# Patient Record
Sex: Male | Born: 1950 | Race: White | Hispanic: No | Marital: Married | Smoking: Former smoker
Health system: Southern US, Community
[De-identification: ages and names within clinical notes are randomized; demographics above are authoritative.]

## PROBLEM LIST (undated history)

## (undated) DIAGNOSIS — R7989 Other specified abnormal findings of blood chemistry: Secondary | ICD-10-CM

## (undated) DIAGNOSIS — Z87438 Personal history of other diseases of male genital organs: Secondary | ICD-10-CM

## (undated) DIAGNOSIS — E785 Hyperlipidemia, unspecified: Secondary | ICD-10-CM

## (undated) HISTORY — DX: Personal history of other diseases of male genital organs: Z87.438

## (undated) HISTORY — DX: Hyperlipidemia, unspecified: E78.5

## (undated) HISTORY — DX: Other specified abnormal findings of blood chemistry: R79.89

## (undated) HISTORY — PX: COLONOSCOPY W/ BIOPSIES: SHX1374

---

## 2007-07-18 ENCOUNTER — Ambulatory Visit: Payer: Self-pay | Admitting: Urology

## 2007-07-18 IMAGING — CR DG ABDOMEN 1V
1 series · 1 of 1 positions shown · non-contrast
Comparison: none

REASON FOR EXAM: nephrolithiasis, pt need films
COMMENTS:

PROCEDURE:     DXR - DXR KIDNEY URETER BLADDER  - [DATE] [DATE]
RESULT:     The bowel gas pattern is within the limits of normal. There is
faint calcification in the LEFT kidney. The bony structures are within the
limits of normal for age.

[view not recorded]
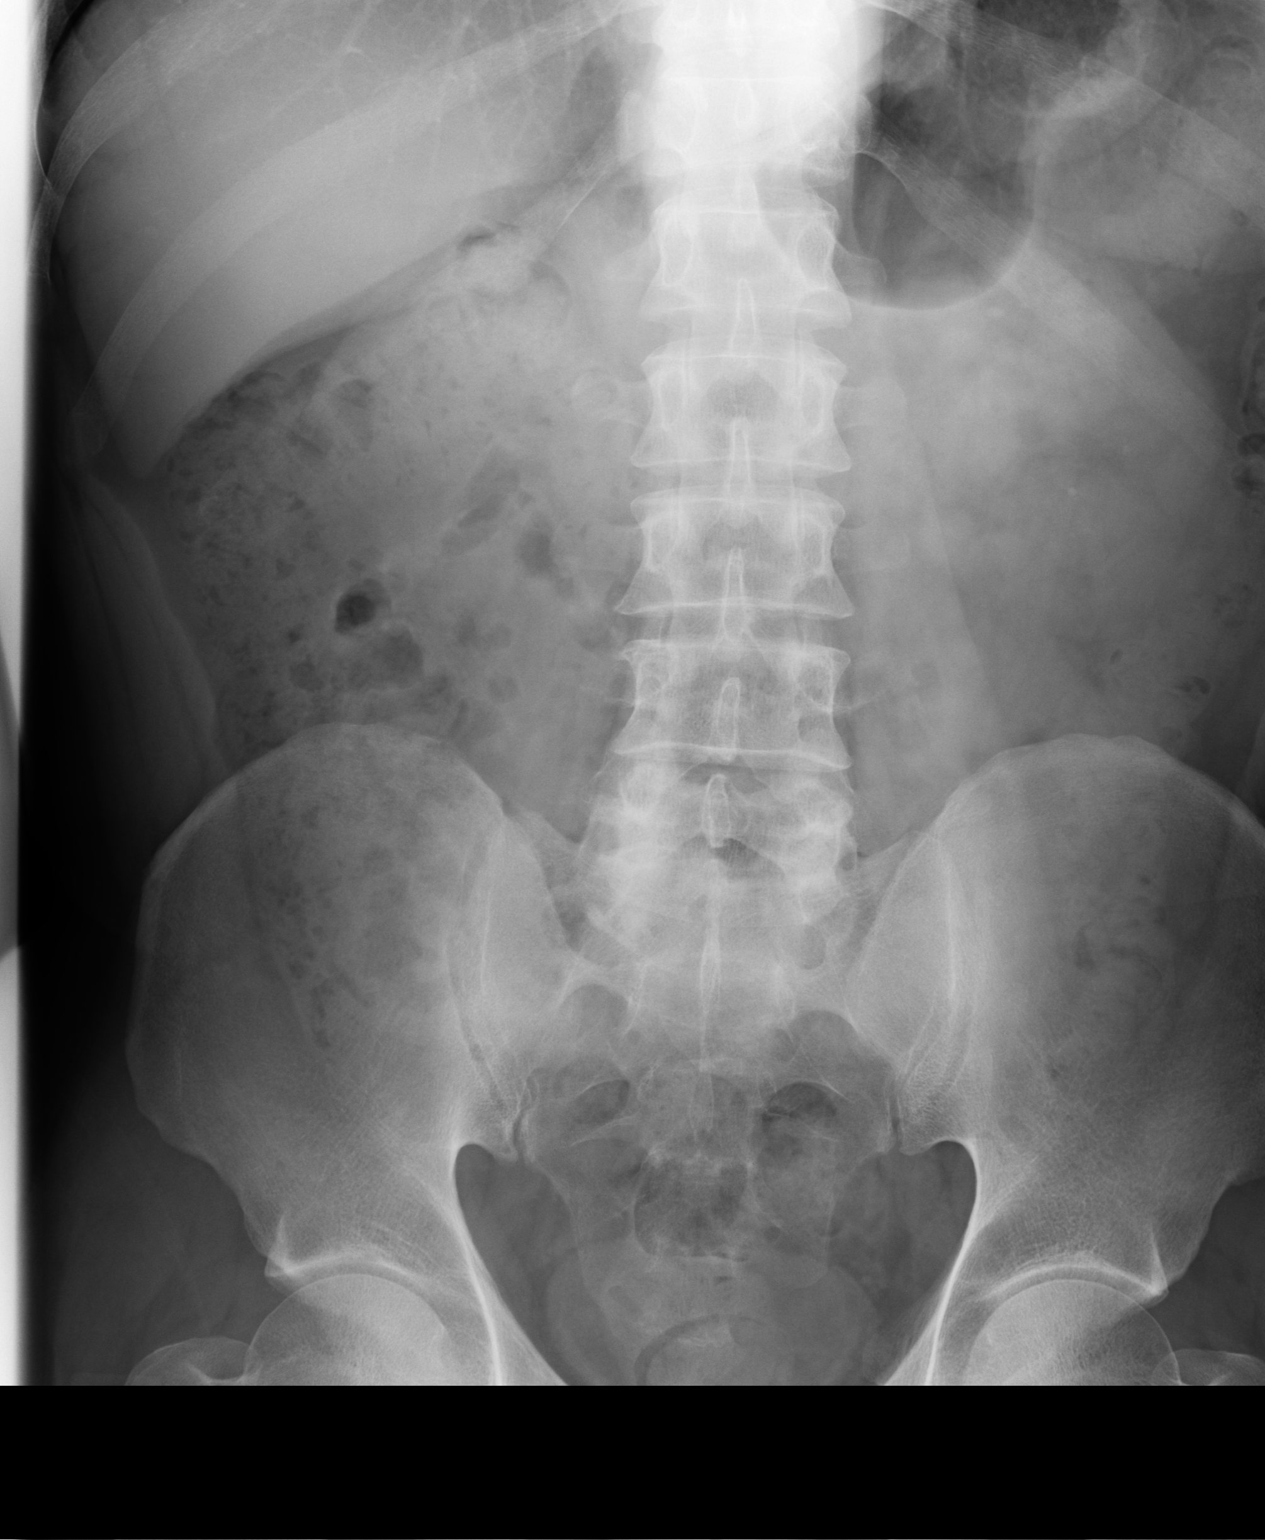

[1 of 1 positions shown; findings below may reference images not displayed]

IMPRESSION: There are faint calcific densities projecting over the LEFT
kidney and possibly over the RIGHT. These may reflect stones. Further
interpretation is deferred to Dr. LAHLAL.

## 2008-03-19 ENCOUNTER — Ambulatory Visit: Payer: Self-pay | Admitting: Urology

## 2014-03-23 ENCOUNTER — Ambulatory Visit: Payer: Self-pay | Admitting: Gastroenterology

## 2014-06-22 LAB — SURGICAL PATHOLOGY

## 2014-12-18 ENCOUNTER — Ambulatory Visit (INDEPENDENT_AMBULATORY_CARE_PROVIDER_SITE_OTHER): Payer: Worker's Compensation

## 2014-12-18 ENCOUNTER — Ambulatory Visit
Admission: EM | Admit: 2014-12-18 | Discharge: 2014-12-18 | Disposition: A | Discharge status: Home / Self Care | Payer: Worker's Compensation | Attending: Family Medicine | Admitting: Family Medicine

## 2014-12-18 DIAGNOSIS — S46911A Strain of unspecified muscle, fascia and tendon at shoulder and upper arm level, right arm, initial encounter: Secondary | ICD-10-CM

## 2014-12-18 MED ORDER — IBUPROFEN 800 MG PO TABS: 800.0000 mg | ORAL_TABLET | Freq: Three times a day (TID) | ORAL | Status: DC

## 2014-12-18 NOTE — ED Provider Notes (Signed)
CSN: [id number]     Arrival date & time 12/18/14  1013 History   First MD Initiated Contact with Patient 12/18/14 1054     Chief Complaint  Patient presents with  . Shoulder Injury   (Consider location/radiation/quality/duration/timing/severity/associated sxs/prior Treatment) HPI Comments: Single caucasian male reported 1 month ago at [facility] was maintaining equipment changing out cylinder kneeling and wrench slipped and he fell on outstretched right hand felt pop in his shoulder.  Right hand dominant.  Has tried rest, ice, stretching, motrin without any relief of symptoms.  Grip feels weaker right hand, when he wakes up right hand swollen compared to left.  Denied bruising, has full range of motion with pain.  Patient is a 64 y.o. male presenting with shoulder injury. The history is provided by the patient.  Shoulder Injury This is a new problem. The current episode started more than 1 week ago. The problem occurs constantly. The problem has not changed since onset.Pertinent negatives include no chest pain, no abdominal pain, no headaches and no shortness of breath. The symptoms are aggravated by exertion. Nothing relieves the symptoms. He has tried water, food and rest (motrin po prn) for the symptoms.    History reviewed. No pertinent past medical history. History reviewed. No pertinent past surgical history. History reviewed. No pertinent family history. Social History  Substance Use Topics  . Smoking status: Former Games developer  . Smokeless tobacco: None  . Alcohol Use: Yes     Comment: socially    Review of Systems  Constitutional: Negative for fever, chills, diaphoresis, activity change, appetite change, fatigue and unexpected weight change.  HENT: Negative for congestion, dental problem, drooling, ear discharge, ear pain, facial swelling, hearing loss, mouth sores and nosebleeds.   Eyes: Negative for photophobia, pain, discharge, redness, itching and visual disturbance.  Respiratory:  Negative for cough, choking, shortness of breath, wheezing and stridor.   Cardiovascular: Negative for chest pain, palpitations and leg swelling.  Gastrointestinal: Negative for nausea, vomiting, abdominal pain, diarrhea, constipation, blood in stool and abdominal distention.  Endocrine: Negative for cold intolerance and heat intolerance.  Genitourinary: Negative for dysuria.  Musculoskeletal: Positive for myalgias and arthralgias. Negative for back pain, joint swelling, gait problem, neck pain and neck stiffness.  Skin: Negative for color change, pallor, rash and wound.  Allergic/Immunologic: Negative for environmental allergies and food allergies.  Neurological: Positive for weakness and numbness. Negative for dizziness, tremors, seizures, syncope, facial asymmetry, speech difficulty, light-headedness and headaches.  Hematological: Negative for adenopathy. Does not bruise/bleed easily.  Psychiatric/Behavioral: Negative for behavioral problems, confusion, sleep disturbance and agitation.    Allergies  Review of patient's allergies indicates no known allergies.  Home Medications   Prior to Admission medications   Medication Sig Start Date End Date Taking? Authorizing Provider  ibuprofen (ADVIL,MOTRIN) 800 MG tablet Take 1 tablet (800 mg total) by mouth 3 (three) times daily. 12/18/14   Barbaraann Barthel, NP   Meds Ordered and Administered this Visit  Medications - No data to display  BP 149/80 mmHg  Pulse 75  Temp(Src) 97.2 F (36.2 C) (Tympanic)  Resp 16  Ht  (1.778 m)  Wt 168 lb (76.204 kg)  BMI 24.11 kg/m2  SpO2 98% No data found.   Physical Exam  Constitutional: He is oriented to person, place, and time. Vital signs are normal. He appears well-developed and well-nourished. No distress.  HENT:  Head: Normocephalic and atraumatic.  Right Ear: External ear normal.  Left Ear: External ear normal.  Nose:  Nose normal.  Mouth/Throat: Oropharynx is clear and moist. No  oropharyngeal exudate.  Eyes: Conjunctivae, EOM and lids are normal. Pupils are equal, round, and reactive to light. Right eye exhibits no discharge. Left eye exhibits no discharge. No scleral icterus.  Neck: Trachea normal and normal range of motion. Neck supple. No tracheal tenderness, no spinous process tenderness and no muscular tenderness present. No rigidity. No tracheal deviation, no edema, no erythema and normal range of motion present.  Cardiovascular: Normal rate, regular rhythm, normal heart sounds and intact distal pulses.   Pulmonary/Chest: Effort normal and breath sounds normal. No stridor. No respiratory distress. He has no wheezes. He has no rales.  Abdominal: Soft. He exhibits no distension.  Musculoskeletal: He exhibits no edema or tenderness.       Right shoulder: He exhibits decreased range of motion and pain. He exhibits no tenderness, no bony tenderness, no swelling, no effusion, no crepitus, no deformity, no laceration, no spasm, normal pulse and normal strength.       Left shoulder: Normal.       Right elbow: Normal.      Left elbow: Normal.       Right wrist: Normal.       Left wrist: Normal.       Cervical back: Normal.       Thoracic back: Normal.       Right upper arm: Normal.       Left upper arm: Normal.       Right forearm: Normal.       Left forearm: Normal.       Arms:      Right hand: Normal.       Left hand: Normal.  Neurological: He is alert and oriented to person, place, and time. He is not disoriented. He displays no atrophy, no tremor and normal reflexes. No cranial nerve deficit or sensory deficit. He exhibits normal muscle tone. He displays no seizure activity. Coordination and gait normal. GCS eye subscore is 4. GCS verbal subscore is 5. GCS motor subscore is 6.  Reflex Scores:      Brachioradialis reflexes are 1+ on the right side and 1+ on the left side. Bilateral hand grasp equal bilateral strength extremities 5/5 bilaterally  Skin: Skin is warm,  dry and intact. No abrasion, no bruising, no burn, no ecchymosis, no laceration, no lesion, no petechiae and no rash noted. He is not diaphoretic. No cyanosis or erythema. No pallor. Nails show no clubbing.  Psychiatric: He has a normal mood and affect. His speech is normal and behavior is normal. Judgment and thought content normal. Cognition and memory are normal.  Nursing note and vitals reviewed.   ED Course  Procedures (including critical care time)  Labs Review Labs Reviewed - No data to display  Imaging Review Dg Shoulder Right  12/18/2014  CLINICAL DATA:  Shoulder pain.  Injury 1 month ago. EXAM: RIGHT SHOULDER - 2+ VIEW COMPARISON:  None. FINDINGS: There is no evidence of fracture or dislocation. There is no evidence of arthropathy or other focal bone abnormality. Soft tissues are unremarkable. IMPRESSION: Negative. Electronically Signed   By: Marlan Palauharles  Clark M.D.   On: 12/18/2014 11:26    1145 discussed xray results normal/negative for fracture or dislocation. Given copy of radiology report.  Patient preferred to continue motrin 800mg  po TID.  Rx for PT right shoulder TIW x 6 weeks given to patient and he is to follow up with HR rep for scheduling of appt.  Discussed companies typically have contract with a provider.  Follow up in 10 days for re-evaluation.  Perform shoulder exercises per exitcare handout until first PT appt.  Avoid keeping arm in sling but avoid lifting/pushing/pulling greater than 5 lbs, impact to right hand.  Patient verbalized understanding of information/instructions, agreed with plan of care and had no further questions at this time.  MDM   1. Right shoulder strain, initial encounter    Motrin  po TID prn pain cryotherapy TID prn.  Rx for physical therapy three times per week x 6 weeks given to patient.  Home stretches demonstrated to patient-e.g. Arm circles, walking up wall  Consider orthopedics referral/MRI if no improvement with prescribed therapy.   Avoid lifting with right hand, avoid impact and vibrations to right hand.  Patient was instructed to rest, ice, and ROM exercises.  Activity as tolerated.    Follow up sooner if symptoms worsen or resolve prior to 10 days.  Exitcare handout on shoulder strain, rotator cuff injury, shoulder pain given to patient.  Patient verbalized agreement and understanding of treatment plan and had no further questions at this time.  P2:  Injury Prevention and Fitness.    Barbaraann Barthel, NP 12/18/14 1407

## 2014-12-18 NOTE — ED Notes (Signed)
States at work 1 month ago and pulling a cylinder out of something and fell backwards and felt a "pop" right shoulder. Pain not improving. Able to lift arm, but very painful

## 2014-12-18 NOTE — Discharge Instructions (Signed)
Cryotherapy °Cryotherapy means treatment with cold. Ice or gel packs can be used to reduce both pain and swelling. Ice is the most helpful within the first 24 to 48 hours after an injury or flare-up from overusing a muscle or joint. Sprains, strains, spasms, burning pain, shooting pain, and aches can all be eased with ice. Ice can also be used when recovering from surgery. Ice is effective, has very few side effects, and is safe for most people to use. °PRECAUTIONS  °Ice is not a safe treatment option for people with: °· Raynaud phenomenon. This is a condition affecting small blood vessels in the extremities. Exposure to cold may cause your problems to return. °· Cold hypersensitivity. There are many forms of cold hypersensitivity, including: °· Cold urticaria. Red, itchy hives appear on the skin when the tissues begin to warm after being iced. °· Cold erythema. This is a red, itchy rash caused by exposure to cold. °· Cold hemoglobinuria. Red blood cells break down when the tissues begin to warm after being iced. The hemoglobin that carry oxygen are passed into the urine because they cannot combine with blood proteins fast enough. °· Numbness or altered sensitivity in the area being iced. °If you have any of the following conditions, do not use ice until you have discussed cryotherapy with your caregiver: °· Heart conditions, such as arrhythmia, angina, or chronic heart disease. °· High blood pressure. °· Healing wounds or open skin in the area being iced. °· Current infections. °· Rheumatoid arthritis. °· Poor circulation. °· Diabetes. °Ice slows the blood flow in the region it is applied. This is beneficial when trying to stop inflamed tissues from spreading irritating chemicals to surrounding tissues. However, if you expose your skin to cold temperatures for too long or without the proper protection, you can damage your skin or nerves. Watch for signs of skin damage due to cold. °HOME CARE INSTRUCTIONS °Follow  these tips to use ice and cold packs safely. °· Place a dry or damp towel between the ice and skin. A damp towel will cool the skin more quickly, so you may need to shorten the time that the ice is used. °· For a more rapid response, add gentle compression to the ice. °· Ice for no more than 10 to 20 minutes at a time. The bonier the area you are icing, the less time it will take to get the benefits of ice. °· Check your skin after 5 minutes to make sure there are no signs of a poor response to cold or skin damage. °· Rest 20 minutes or more between uses. °· Once your skin is numb, you can end your treatment. You can test numbness by very lightly touching your skin. The touch should be so light that you do not see the skin dimple from the pressure of your fingertip. When using ice, most people will feel these normal sensations in this order: cold, burning, aching, and numbness. °· Do not use ice on someone who cannot communicate their responses to pain, such as small children or people with dementia. °HOW TO MAKE AN ICE PACK °Ice packs are the most common way to use ice therapy. Other methods include ice massage, ice baths, and cryosprays. Muscle creams that cause a cold, tingly feeling do not offer the same benefits that ice offers and should not be used as a substitute unless recommended by your caregiver. °To make an ice pack, do one of the following: °· Place crushed ice or a   bag of frozen vegetables in a sealable plastic bag. Squeeze out the excess air. Place this bag inside another plastic bag. Slide the bag into a pillowcase or place a damp towel between your skin and the bag.  Mix 3 parts water with 1 part rubbing alcohol. Freeze the mixture in a sealable plastic bag. When you remove the mixture from the freezer, it will be slushy. Squeeze out the excess air. Place this bag inside another plastic bag. Slide the bag into a pillowcase or place a damp towel between your skin and the bag. SEEK MEDICAL CARE  IF:  You develop white spots on your skin. This may give the skin a blotchy (mottled) appearance.  Your skin turns blue or pale.  Your skin becomes waxy or hard.  Your swelling gets worse. MAKE SURE YOU:   Understand these instructions.  Will watch your condition.  Will get help right away if you are not doing well or get worse.   This information is not intended to replace advice given to you by your health care provider. Make sure you discuss any questions you have with your health care provider.   Document Released: 10/10/2010 Document Revised: 03/06/2014 Document Reviewed: 10/10/2010 Elsevier Interactive Patient Education 2016 [employer]  Muscle Strain A muscle strain is an injury that occurs when a muscle is stretched beyond its normal length. Usually a small number of muscle fibers are torn when this happens. Muscle strain is rated in degrees. First-degree strains have the least amount of muscle fiber tearing and pain. Second-degree and third-degree strains have increasingly more tearing and pain.  Usually, recovery from muscle strain takes 1-2 weeks. Complete healing takes 5-6 weeks.  CAUSES  Muscle strain happens when a sudden, violent force placed on a muscle stretches it too far. This may occur with lifting, sports, or a fall.  RISK FACTORS Muscle strain is especially common in athletes.  SIGNS AND SYMPTOMS At the site of the muscle strain, there may be:  Pain.  Bruising.  Swelling.  Difficulty using the muscle due to pain or lack of normal function. DIAGNOSIS  Your health care provider will perform a physical exam and ask about your medical history. TREATMENT  Often, the best treatment for a muscle strain is resting, icing, and applying cold compresses to the injured area.  HOME CARE INSTRUCTIONS   Use the PRICE method of treatment to promote muscle healing during the first 2-3 days after your injury. The PRICE method involves:  Protecting the muscle  from being injured again.  Restricting your activity and resting the injured body part.  Icing your injury. To do this, put ice in a plastic bag. Place a towel between your skin and the bag. Then, apply the ice and leave it on from 15-20 minutes each hour. After the third day, switch to moist heat packs.  Apply compression to the injured area with a splint or elastic bandage. Be careful not to wrap it too tightly. This may interfere with blood circulation or increase swelling.  Elevate the injured body part above the level of your heart as often as you can.  Only take over-the-counter or prescription medicines for pain, discomfort, or fever as directed by your health care provider.  Warming up prior to exercise helps to prevent future muscle strains. SEEK MEDICAL CARE IF:   You have increasing pain or swelling in the injured area.  You have numbness, tingling, or a significant loss of strength in the injured area. MAKE SURE  YOU:   Understand these instructions.  Will watch your condition.  Will get help right away if you are not doing well or get worse.   This information is not intended to replace advice given to you by your health care provider. Make sure you discuss any questions you have with your health care provider.   Document Released: 02/13/2005 Document Revised: 12/04/2012 Document Reviewed: 09/12/2012 Elsevier Interactive Patient Education 2016 [employer] Rotator Cuff Injury Rotator cuff injury is any type of injury to the set of muscles and tendons that make up the stabilizing unit of your shoulder. This unit holds the ball of your upper arm bone (humerus) in the socket of your shoulder blade (scapula).  CAUSES Injuries to your rotator cuff most commonly come from sports or activities that cause your arm to be moved repeatedly over your head. Examples of this include throwing, weight lifting, swimming, or racquet sports. Long lasting (chronic) irritation of your  rotator cuff can cause soreness and swelling (inflammation), bursitis, and eventual damage to your tendons, such as a tear (rupture). SIGNS AND SYMPTOMS Acute rotator cuff tear:  Sudden tearing sensation followed by severe pain shooting from your upper shoulder down your arm toward your elbow.  Decreased range of motion of your shoulder because of pain and muscle spasm.  Severe pain.  Inability to raise your arm out to the side because of pain and loss of muscle power (large tears). Chronic rotator cuff tear:  Pain that usually is worse at night and may interfere with sleep.  Gradual weakness and decreased shoulder motion as the pain worsens.  Decreased range of motion. Rotator cuff tendinitis:  Deep ache in your shoulder and the outside upper arm over your shoulder.  Pain that comes on gradually and becomes worse when lifting your arm to the side or turning it inward. DIAGNOSIS Rotator cuff injury is diagnosed through a medical history, physical exam, and imaging exam. The medical history helps determine the type of rotator cuff injury. Your health care provider will look at your injured shoulder, feel the injured area, and ask you to move your shoulder in different positions. X-ray exams typically are done to rule out other causes of shoulder pain, such as fractures. MRI is the exam of choice for the most severe shoulder injuries because the images show muscles and tendons.  TREATMENT  Chronic tear:  Medicine for pain, such as acetaminophen or ibuprofen.  Physical therapy and range-of-motion exercises may be helpful in maintaining shoulder function and strength.  Steroid injections into your shoulder joint.  Surgical repair of the rotator cuff if the injury does not heal with noninvasive treatment. Acute tear:  Anti-inflammatory medicines such as ibuprofen and naproxen to help reduce pain and swelling.  A sling to help support your arm and rest your rotator cuff muscles.  Long-term use of a sling is not advised. It may cause significant stiffening of the shoulder joint.  Surgery may be considered within a few weeks, especially in younger, active people, to return the shoulder to full function.  Indications for surgical treatment include the following:  Age younger than 60 years.  Rotator cuff tears that are complete.  Physical therapy, rest, and anti-inflammatory medicines have been used for 6-8 weeks, with no improvement.  Employment or sporting activity that requires constant shoulder use. Tendinitis:  Anti-inflammatory medicines such as ibuprofen and naproxen to help reduce pain and swelling.  A sling to help support your arm and rest your rotator cuff muscles. Long-term  use of a sling is not advised. It may cause significant stiffening of the shoulder joint.  Severe tendinitis may require:  Steroid injections into your shoulder joint.  Physical therapy.  Surgery. HOME CARE INSTRUCTIONS   Apply ice to your injury:  Put ice in a plastic bag.  Place a towel between your skin and the bag.  Leave the ice on for 20 minutes, 2-3 times a day.  If you have a shoulder immobilizer (sling and straps), wear it until told otherwise by your health care provider.  You may want to sleep on several pillows or in a recliner at night to lessen swelling and pain.  Only take over-the-counter or prescription medicines for pain, discomfort, or fever as directed by your health care provider.  Do simple hand squeezing exercises with a soft rubber ball to decrease hand swelling. SEEK MEDICAL CARE IF:   Your shoulder pain increases, or new pain or numbness develops in your arm, hand, or fingers.  Your hand or fingers are colder than your other hand. SEEK IMMEDIATE MEDICAL CARE IF:   Your arm, hand, or fingers are numb or tingling.  Your arm, hand, or fingers are increasingly swollen and painful, or they turn white or blue. MAKE SURE YOU:  Understand  these instructions.  Will watch your condition.  Will get help right away if you are not doing well or get worse.   This information is not intended to replace advice given to you by your health care provider. Make sure you discuss any questions you have with your health care provider.   Document Released: 02/11/2000 Document Revised: 02/18/2013 Document Reviewed: 09/25/2012 Elsevier Interactive Patient Education 2016 [employer] Shoulder Sprain A shoulder sprain is a partial or complete tear in one of the tough, fiber-like tissues (ligaments) in the shoulder. The ligaments in the shoulder help to hold the shoulder in place. CAUSES This condition may be caused by:  A fall.  A hit to the shoulder.  A twist of the arm. RISK FACTORS This condition is more likely to develop in:  People who play sports.  People who have problems with balance or coordination. SYMPTOMS Symptoms of this condition include:  Pain when moving the shoulder.  Limited ability to move the shoulder.  Swelling and tenderness on top of the shoulder.  Warmth in the shoulder.  A change in the shape of the shoulder.  Redness or bruising on the shoulder. DIAGNOSIS This condition is diagnosed with a physical exam. During the exam, you may be asked to do simple exercises with your shoulder. You may also have imaging tests, such as X-rays, MRI, or a CT scan. These tests can show how severe the sprain is. TREATMENT This condition may be treated with:  Rest.  Pain medicine.  Ice.  A sling or brace. This is used to keep the arm still while the shoulder is healing.  Physical therapy or rehabilitation exercises. These help to improve the range of motion and strength of the shoulder.  Surgery (rare). Surgery may be needed if the sprain caused a joint to become unstable. Surgery may also be needed to reduce pain. Some people may develop ongoing shoulder pain or lose some range of motion in the shoulder.  However, most people do not develop long-term problems. HOME CARE INSTRUCTIONS  Rest.  Take over-the-counter and prescription medicines only as told by your health care provider.  If directed, apply ice to the area:  Put ice in a plastic bag.  Place  a towel between your skin and the bag.  Leave the ice on for 20 minutes, 2-3 times per day.  If you were given a shoulder sling or brace:  Wear it as told.  Remove it to shower or bathe.  Move your arm only as much as told by your health care provider, but keep your hand moving to prevent swelling.  If you were shown how to do any exercises, do them as told by your health care provider.  Keep all follow-up visits as told by your health care provider. This is important. SEEK MEDICAL CARE IF:  Your pain gets worse.  Your pain is not relieved with medicines.  You have increased redness or swelling. SEEK IMMEDIATE MEDICAL CARE IF:  You have a fever.  You cannot move your arm or shoulder.  You develop numbness or tingling in your arms, hands, or fingers.   This information is not intended to replace advice given to you by your health care provider. Make sure you discuss any questions you have with your health care provider.   Document Released: 07/02/2008 Document Revised: 11/04/2014 Document Reviewed: 06/08/2014 Elsevier Interactive Patient Education 2016 [employer] Shoulder Range of Motion Exercises Shoulder range of motion (ROM) exercises are designed to keep the shoulder moving freely. They are often recommended for people who have shoulder pain. MOVEMENT EXERCISE When you are able, do this exercise 5-6 days per week, or as told by your health care provider. Work toward doing 2 sets of 10 swings. Pendulum Exercise How To Do This Exercise Lying Down  Lie face-down on a bed with your abdomen close to the side of the bed.  Let your arm hang over the side of the bed.  Relax your shoulder, arm, and hand.  Slowly and  gently swing your arm forward and back. Do not use your neck muscles to swing your arm. They should be relaxed. If you are struggling to swing your arm, have someone gently swing it for you. When you do this exercise for the first time, swing your arm at a 15 degree angle for 15 seconds, or swing your arm 10 times. As pain lessens over time, increase the angle of the swing to 30-45 degrees.  Repeat steps 1-4 with the other arm. How To Do This Exercise While Standing  Stand next to a sturdy chair or table and hold on to it with your hand.  Bend forward at the waist.  Bend your knees slightly.  Relax your other arm and let it hang limp.  Relax the shoulder blade of the arm that is hanging and let it drop.  While keeping your shoulder relaxed, use body motion to swing your arm in small circles. The first time you do this exercise, swing your arm for about 30 seconds or 10 times. When you do it next time, swing your arm for a little longer.  Stand up tall and relax.  Repeat steps 1-7, this time changing the direction of the circles.  Repeat steps 1-8 with the other arm. STRETCHING EXERCISES Do these exercises 3-4 times per day on 5-6 days per week or as told by your health care provider. Work toward holding the stretch for 20 seconds. Stretching Exercise 1  Lift your arm straight out in front of you.  Bend your arm 90 degrees at the elbow (right angle) so your forearm goes across your body and looks like the letter "L."  Use your other arm to gently pull the elbow forward  and across your body.  Repeat steps 1-3 with the other arm. Stretching Exercise 2 You will need a towel or rope for this exercise.  Bend one arm behind your back with the palm facing outward.  Hold a towel with your other hand.  Reach the arm that holds the towel above your head, and bend that arm at the elbow. Your wrist should be behind your neck.  Use your free hand to grab the free end of the towel.  With  the higher hand, gently pull the towel up behind you.  With the lower hand, pull the towel down behind you.  Repeat steps 1-6 with the other arm. STRENGTHENING EXERCISES Do each of these exercises at four different times of day (sessions) every day or as told by your health care provider. To begin with, repeat each exercise 5 times (repetitions). Work toward doing 3 sets of 12 repetitions or as told by your health care provider. Strengthening Exercise 1 You will need a light weight for this activity. As you grow stronger, you may use a heavier weight.  Standing with a weight in your hand, lift your arm straight out to the side until it is at the same height as your shoulder.  Bend your arm at 90 degrees so that your fingers are pointing to the ceiling.  Slowly raise your hand until your arm is straight up in the air.  Repeat steps 1-3 with the other arm. Strengthening Exercise 2 You will need a light weight for this activity. As you grow stronger, you may use a heavier weight.  Standing with a weight in your hand, gradually move your straight arm in an arc, starting at your side, then out in front of you, then straight up over your head.  Gradually move your other arm in an arc, starting at your side, then out in front of you, then straight up over your head.  Repeat steps 1-2 with the other arm. Strengthening Exercise 3 You will need an elastic band for this activity. As you grow stronger, gradually increase the size of the bands or increase the number of bands that you use at one time.  While standing, hold an elastic band in one hand and raise that arm up in the air.  With your other hand, pull down the band until that hand is by your side.  Repeat steps 1-2 with the other arm.   This information is not intended to replace advice given to you by your health care provider. Make sure you discuss any questions you have with your health care provider.   Document Released:  11/12/2002 Document Revised: 06/30/2014 Document Reviewed: 02/09/2014 Elsevier Interactive Patient Education [employer]

## 2015-01-12 ENCOUNTER — Encounter: Payer: Self-pay | Admitting: Family Medicine

## 2015-01-12 ENCOUNTER — Ambulatory Visit (INDEPENDENT_AMBULATORY_CARE_PROVIDER_SITE_OTHER): Payer: BLUE CROSS/BLUE SHIELD | Admitting: Family Medicine

## 2015-01-12 VITALS — BP 126/79 | HR 75 | Temp 97.8°F | Ht 69.5 in | Wt 166.0 lb

## 2015-01-12 DIAGNOSIS — E291 Testicular hypofunction: Secondary | ICD-10-CM | POA: Diagnosis not present

## 2015-01-12 DIAGNOSIS — N401 Enlarged prostate with lower urinary tract symptoms: Secondary | ICD-10-CM

## 2015-01-12 DIAGNOSIS — N138 Other obstructive and reflux uropathy: Secondary | ICD-10-CM

## 2015-01-12 DIAGNOSIS — E785 Hyperlipidemia, unspecified: Secondary | ICD-10-CM

## 2015-01-12 DIAGNOSIS — R7989 Other specified abnormal findings of blood chemistry: Secondary | ICD-10-CM | POA: Insufficient documentation

## 2015-01-12 DIAGNOSIS — Z0000 Encounter for general adult medical examination without abnormal findings: Secondary | ICD-10-CM

## 2015-01-12 DIAGNOSIS — L989 Disorder of the skin and subcutaneous tissue, unspecified: Secondary | ICD-10-CM

## 2015-01-12 LAB — URINALYSIS, ROUTINE W REFLEX MICROSCOPIC
Bilirubin, UA: NEGATIVE
Glucose, UA: NEGATIVE
Ketones, UA: NEGATIVE
LEUKOCYTES UA: NEGATIVE
Nitrite, UA: NEGATIVE
PROTEIN UA: NEGATIVE
RBC, UA: NEGATIVE
SPEC GRAV UA: 1.015 (ref 1.005–1.030)
UUROB: 0.2 mg/dL (ref 0.2–1.0)
pH, UA: 7.5 (ref 5.0–7.5)

## 2015-01-12 MED ORDER — ATORVASTATIN CALCIUM 10 MG PO TABS: 10.0000 mg | ORAL_TABLET | Freq: Every day | ORAL | Status: DC

## 2015-01-12 NOTE — Progress Notes (Signed)
BP 126/79 mmHg  Pulse 75  Temp(Src) 97.8 F (36.6 C)  Ht 5' 9.5" (1.765 m)  Wt 166 lb (75.297 kg)  BMI 24.17 kg/m2  SpO2 98%   Subjective:    Patient ID: Dennis Mclaughlin, male    DOB: 1950-03-25, 64 y.o.   MRN: [id number]  HPI: Dennis Mclaughlin is a 64 y.o. male  Chief Complaint  Patient presents with  . Establish Care   Patient doing well taking Lipitor for cholesterol management has high cholesterol without it Patient also with BPH symptoms with lower urinary tract symptoms has been on Avodart previously but not on recently and is interested in going back on medication or something as having symptoms of slow urine delayed stream Patient also has hurt his shoulder at work has my ibuprofen he takes on occasion  long-term no energy tired all the time could sleep a lot Asymmetry much just  sleeps and works no hobbies   Relevant past medical, surgical, family and social history reviewed and updated as indicated. Interim medical history since our last visit reviewed. Allergies and medications reviewed and updated.  Review of Systems  Constitutional: Negative.   HENT: Negative.   Eyes: Negative.   Respiratory: Negative.   Cardiovascular: Negative.   Gastrointestinal: Negative.   Endocrine: Negative.   Genitourinary: Negative.   Musculoskeletal: Negative.   Skin: Negative.   Allergic/Immunologic: Negative.   Neurological: Negative.   Hematological: Negative.   Psychiatric/Behavioral: Negative.     Per HPI unless specifically indicated above     Objective:    BP 126/79 mmHg  Pulse 75  Temp(Src) 97.8 F (36.6 C)  Ht 5' 9.5" (1.765 m)  Wt 166 lb (75.297 kg)  BMI 24.17 kg/m2  SpO2 98%  Wt Readings from Last 3 Encounters:  01/12/15 166 lb (75.297 kg)  12/18/14 168 lb (76.204 kg)    Physical Exam  Constitutional: He is oriented to person, place, and time. He appears well-developed and well-nourished.  HENT:  Head: Normocephalic and atraumatic.  Right Ear:  External ear normal.  Left Ear: External ear normal.  Eyes: Conjunctivae and EOM are normal. Pupils are equal, round, and reactive to light.  Neck: Normal range of motion. Neck supple.  Cardiovascular: Normal rate, regular rhythm, normal heart sounds and intact distal pulses.   Pulmonary/Chest: Effort normal and breath sounds normal.  Abdominal: Soft. Bowel sounds are normal. There is no splenomegaly or hepatomegaly.  Genitourinary: Rectum normal and penis normal.  Enlarged prostate  Musculoskeletal: Normal range of motion.  Neurological: He is alert and oriented to person, place, and time. He has normal reflexes.  Skin: No rash noted. No erythema.  Psychiatric: He has a normal mood and affect. His behavior is normal. Judgment and thought content normal.        Assessment & Plan:   Problem List Items Addressed This Visit      Musculoskeletal and Integument   Skin lesion of cheek    Nonhealing skin lesions with scabs and bleeding adjacent to left temple Will set up for shave biopsy        Genitourinary   BPH with obstruction/lower urinary tract symptoms    We'll give tamsulosin for now with referral to urology to further evaluate treatment and care      Relevant Orders   Ambulatory referral to Urology     Other   Low testosterone    Will check testosterone today in urology referral for further evaluation and management  Relevant Orders   Testosterone   Hyperlipidemia    The current medical regimen is effective;  continue present plan and medications.       Relevant Medications   atorvastatin (LIPITOR) 10 MG tablet    Other Visit Diagnoses    PE (physical exam), annual    -  Primary    Relevant Orders    Comprehensive metabolic panel    Lipid panel    CBC with Differential/Platelet    PSA    Urinalysis, Routine w reflex microscopic (not at [facility])    TSH        Follow up plan: Return in about 6 months (around 07/12/2015) for Follow-up 6 months for  recheck medication lipid panel, ALT, AST.

## 2015-01-12 NOTE — Assessment & Plan Note (Signed)
Will check testosterone today in urology referral for further evaluation and management

## 2015-01-12 NOTE — Assessment & Plan Note (Signed)
We'll give tamsulosin for now with referral to urology to further evaluate treatment and care

## 2015-01-12 NOTE — Assessment & Plan Note (Signed)
The current medical regimen is effective;  continue present plan and medications.  

## 2015-01-12 NOTE — Assessment & Plan Note (Signed)
Nonhealing skin lesions with scabs and bleeding adjacent to left temple Will set up for shave biopsy

## 2015-01-13 ENCOUNTER — Telehealth: Payer: Self-pay | Admitting: Family Medicine

## 2015-01-13 LAB — COMPREHENSIVE METABOLIC PANEL
ALBUMIN: 4.6 g/dL (ref 3.6–4.8)
ALK PHOS: 76 IU/L (ref 39–117)
ALT: 17 IU/L (ref 0–44)
AST: 21 IU/L (ref 0–40)
Albumin/Globulin Ratio: 2.4 (ref 1.1–2.5)
BUN / CREAT RATIO: 13 (ref 10–22)
BUN: 12 mg/dL (ref 8–27)
Bilirubin Total: 0.5 mg/dL (ref 0.0–1.2)
CO2: 24 mmol/L (ref 18–29)
CREATININE: 0.94 mg/dL (ref 0.76–1.27)
Calcium: 9.4 mg/dL (ref 8.6–10.2)
Chloride: 99 mmol/L (ref 97–106)
GFR calc Af Amer: 100 mL/min/{1.73_m2} (ref >59)
GFR calc non Af Amer: 87 mL/min/{1.73_m2} (ref >59)
GLUCOSE: 100 mg/dL — AB (ref 65–99)
Globulin, Total: 1.9 g/dL (ref 1.5–4.5)
Potassium: 4.3 mmol/L (ref 3.5–5.2)
Sodium: 140 mmol/L (ref 136–144)
TOTAL PROTEIN: 6.5 g/dL (ref 6.0–8.5)

## 2015-01-13 LAB — LIPID PANEL
CHOLESTEROL TOTAL: 271 mg/dL — AB (ref 100–199)
Chol/HDL Ratio: 5.8 ratio units — ABNORMAL HIGH (ref 0.0–5.0)
HDL: 47 mg/dL (ref >39)
LDL CALC: 205 mg/dL — AB (ref 0–99)
Triglycerides: 96 mg/dL (ref 0–149)
VLDL Cholesterol Cal: 19 mg/dL (ref 5–40)

## 2015-01-13 LAB — CBC WITH DIFFERENTIAL/PLATELET
BASOS: 1 %
Basophils Absolute: 0 10*3/uL (ref 0.0–0.2)
EOS (ABSOLUTE): 0.1 10*3/uL (ref 0.0–0.4)
Eos: 1 %
Hematocrit: 43.3 % (ref 37.5–51.0)
Hemoglobin: 14.5 g/dL (ref 12.6–17.7)
IMMATURE GRANULOCYTES: 0 %
Immature Grans (Abs): 0 10*3/uL (ref 0.0–0.1)
Lymphocytes Absolute: 1.7 10*3/uL (ref 0.7–3.1)
Lymphs: 25 %
MCH: 31 pg (ref 26.6–33.0)
MCHC: 33.5 g/dL (ref 31.5–35.7)
MCV: 93 fL (ref 79–97)
MONOS ABS: 0.5 10*3/uL (ref 0.1–0.9)
Monocytes: 8 %
NEUTROS ABS: 4.3 10*3/uL (ref 1.4–7.0)
NEUTROS PCT: 65 %
Platelets: 232 10*3/uL (ref 150–379)
RBC: 4.68 x10E6/uL (ref 4.14–5.80)
RDW: 13.1 % (ref 12.3–15.4)
WBC: 6.6 10*3/uL (ref 3.4–10.8)

## 2015-01-13 LAB — PSA: Prostate Specific Ag, Serum: 1.1 ng/mL (ref 0.0–4.0)

## 2015-01-13 LAB — TSH: TSH: 3.85 u[IU]/mL (ref 0.450–4.500)

## 2015-01-13 LAB — TESTOSTERONE: Testosterone: 180 ng/dL — ABNORMAL LOW (ref 348–1197)

## 2015-01-13 NOTE — Telephone Encounter (Signed)
-----   Message from Lurlean HornsNancy H Wilson, CMA sent at 01/13/2015  5:21 PM EST ----- labs

## 2015-01-13 NOTE — Telephone Encounter (Signed)
Phone call Discussed with patient cholesterol terribly high. Patient has not started cholesterol medicine yet discussed and patient will start. Recheck lipid panel next office visit

## 2015-02-04 ENCOUNTER — Ambulatory Visit
Admission: EM | Admit: 2015-02-04 | Discharge: 2015-02-04 | Disposition: A | Discharge status: Home / Self Care | Payer: Worker's Compensation | Attending: Family Medicine | Admitting: Family Medicine

## 2015-02-04 DIAGNOSIS — S46911D Strain of unspecified muscle, fascia and tendon at shoulder and upper arm level, right arm, subsequent encounter: Secondary | ICD-10-CM

## 2015-02-04 NOTE — ED Provider Notes (Signed)
[facility] Emergency Department Provider Note  ____________________________________________  Time seen: Approximately 11:51 AM  I have reviewed the triage vital signs and the nursing notes.   HISTORY  Chief Complaint  Shoulder Pain   HPI Dennis Mclaughlin is a 64 y.o. male presents for complaint of right shoulder pain follow-up. Patient states that he was seen at urgent care approximately 6 weeks ago for initial evaluation of right shoulder pain. Patient states at that time he had been referred for twice a week physical therapy. Patient reports that he has continued with physical therapy and reports that this is helping. Patient does report that he does still continue with some pain. 6 pain is not constant but mostly with arm lifting and raising. Denies pain radiation. Denies new injury.  Patient reports that he continues on a 10 pound weight restriction at this time. Patient states that his physical therapist has had some discussion with him as far as increasing weight restrictions. Patient reports that he works for [employer] and states that the original injury was due to a large piece of equipment slipping and him falling and hearing a pop in his right shoulder. Denies head injury or LOC. Denies other pain or injury. Patient states that injury did occur several weeks prior to even been seen 6 weeks ago in urgent care.  Patient again reports that overall he is feeling better but does still continue with some intermittent pain. Patient reports that at his job he lifts anywhere from 10 pounds to 50+ pounds. Patient reports he has continued to work however no lifting greater than 10 pounds.  Patient states that he is not fully sure of why he is present today but states that he was directed for follow-up.    Past Medical History  Diagnosis Date  . Hyperlipidemia   . History of prostatitis   . Low testosterone     Patient Active Problem List   Diagnosis Date Noted  .  Skin lesion of cheek 01/12/2015  . Hyperlipidemia 01/12/2015  . 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 Low testosterone 01/12/2015    Past Surgical History  Procedure Laterality Date  . Colonoscopy w/ biopsies      Current Outpatient Rx  Name  Route  Sig  Dispense  Refill  . atorvastatin (LIPITOR) 10 MG tablet   Oral   Take 1 tablet (10 mg total) by mouth daily.   90 tablet   4   . ibuprofen (ADVIL,MOTRIN) 800 MG tablet   Oral   Take 1 tablet (800 mg total) by mouth 3 (three) times daily. Patient taking differently: Take 800 mg by mouth every 8 (eight) hours as needed.    30 tablet   0     Allergies Review of patient's allergies indicates no known allergies.  No family history on file.  Social History Social History  Substance Use Topics  . Smoking status: Former Smoker    Types: Cigarettes    Quit date: 01/11/1990  . Smokeless tobacco: Never Used  . Alcohol Use: Yes     Comment: socially    Review of Systems Constitutional: No fever/chills Eyes: No visual changes. ENT: No sore throat.  Cardiovascular: Denies chest pain. Respiratory: Denies shortness of breath. Gastrointestinal: No abdominal pain.  No nausea, no vomiting.  No diarrhea.  No constipation. Genitourinary: Negative for dysuria. Musculoskeletal: Negative for back pain. Right shoulder pain.  Skin: Negative for rash. Neurological: Negative for headaches, focal weakness or numbness.  10-point ROS otherwise negative.  ____________________________________________   PHYSICAL EXAM:  VITAL SIGNS: ED Triage Vitals  Enc Vitals Group     BP 02/04/15 1038 134/92 mmHg     Pulse Rate 02/04/15 1038 72     Resp 02/04/15 1038 16     Temp 02/04/15 1038 97.6 F (36.4 C)     Temp Source 02/04/15 1038 Oral     SpO2 02/04/15 1038 100 %     Weight 02/04/15 1038 170 lb (77.111 kg)     Height 02/04/15 1038  (1.778 m)     Head Cir --      Peak Flow --      Pain Score  02/04/15 1039 0     Pain Loc --      Pain Edu? --      Excl. in GC? --     Constitutional: Alert and oriented. Well appearing and in no acute distress. Eyes: Conjunctivae are normal. PERRL. EOMI. Head: Atraumatic  Nose: No congestion/rhinnorhea.  Mouth/Throat: Mucous membranes are moist.   Neck: No stridor.  No cervical spine tenderness to palpation. Hematological/Lymphatic/Immunilogical: No cervical lymphadenopathy. Cardiovascular: Normal rate, regular rhythm. Grossly normal heart sounds.  Good peripheral circulation. Respiratory: Normal respiratory effort.  No retractions. Lungs CTAB. Gastrointestinal: Soft and nontender. Musculoskeletal: No lower or upper extremity tenderness nor edema. No cervical, thoracic, or lumbar TTP.  Full ROM to bilateral upper and lower extremities. Right shoulder mild anterior shoulder TTP, no swelling, no ecchymosis. Negative drop arm test, negative empty can test, mild pain with resisted abduction, no pain with resisted adduction. Extremities otherwise nontender. Bilateral hand grips equal. No motor or sensation deficits to upper extremities.  Neurologic:  Normal speech and language. No gross focal neurologic deficits are appreciated. No gait instability. Skin:  Skin is warm, dry and intact. No rash noted. Psychiatric: Mood and affect are normal. Speech and behavior are normal.  ____________________________________________   LABS (all labs ordered are listed, but only abnormal results are displayed)  Labs Reviewed - No data to display ____________________________________________   INITIAL IMPRESSION / ASSESSMENT AND PLAN / ED COURSE  Pertinent labs & imaging results that were available during my care of the patient were reviewed by me and considered in my medical decision making (see chart for details).  Very well-appearing patient. No acute distress. This is a subsequent encounter for right shoulder strain and right shoulder pain. 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 Patient has remained on light duty for at least the last 6 weeks due to injury and has been followed with physical therapy. Patient reports that pain has improved and denies pain radiation or knee pain. Patient states that he ia here as he was directed to have a follow-up. Patient with full range of motion present however with some right anterior shoulder pain continues.   As patient with continued pain as well as has continued with physical therapy, at this time will refer patient to follow-up with Tommie Maximiano Coss, FNP at [facility] for further follow up and management. Patient to continue current restrictions set in place and continue with current physical therapy prescription. No changes made at this visit, however patient was referred to follow-up. Patient follow-up with Francesco Sor  within 1 week. Patient denies need for pain medication.   Patient verbalized understanding to this plan and verbalized understanding. Patient agreed to plan.Discussed follow up with Primary care physician this week as needed. Discussed follow up and return parameters including no resolution  or any worsening concerns. Patient verbalized understanding and agreed to plan.   ____________________________________________   FINAL CLINICAL IMPRESSION(S) / ED DIAGNOSES  Final diagnoses:  Right shoulder strain, subsequent encounter       Renford Dills, NP 02/04/15 1251

## 2015-02-04 NOTE — Discharge Instructions (Signed)
[facility] with in one [facility].   Continue current restrictions and directions.   Return to Urgent care as needed for new or worsening concerns.

## 2015-02-04 NOTE — ED Notes (Signed)
Pt here for right shoulder injury WC, states he was told to follow up due to the 10lb restrictions and has been doing physical therapy and feels better.

## 2015-04-07 ENCOUNTER — Other Ambulatory Visit: Payer: Self-pay | Admitting: Orthopedic Surgery

## 2015-04-07 ENCOUNTER — Encounter (HOSPITAL_BASED_OUTPATIENT_CLINIC_OR_DEPARTMENT_OTHER): Payer: Self-pay | Admitting: *Deleted

## 2015-04-12 ENCOUNTER — Encounter (HOSPITAL_BASED_OUTPATIENT_CLINIC_OR_DEPARTMENT_OTHER): Payer: Self-pay | Admitting: *Deleted

## 2015-04-12 ENCOUNTER — Ambulatory Visit (HOSPITAL_BASED_OUTPATIENT_CLINIC_OR_DEPARTMENT_OTHER)
Admission: RE | Admit: 2015-04-12 | Discharge: 2015-04-12 | Disposition: A | Discharge status: Home / Self Care | Payer: Worker's Compensation | Source: Ambulatory Visit | Attending: Orthopedic Surgery | Admitting: Orthopedic Surgery

## 2015-04-12 ENCOUNTER — Ambulatory Visit (HOSPITAL_BASED_OUTPATIENT_CLINIC_OR_DEPARTMENT_OTHER): Payer: Worker's Compensation | Admitting: Anesthesiology

## 2015-04-12 ENCOUNTER — Encounter (HOSPITAL_BASED_OUTPATIENT_CLINIC_OR_DEPARTMENT_OTHER)
Admission: RE | Disposition: A | Discharge status: Home / Self Care | Payer: Self-pay | Source: Ambulatory Visit | Attending: Orthopedic Surgery

## 2015-04-12 DIAGNOSIS — Z79899 Other long term (current) drug therapy: Secondary | ICD-10-CM | POA: Insufficient documentation

## 2015-04-12 DIAGNOSIS — S46011A Strain of muscle(s) and tendon(s) of the rotator cuff of right shoulder, initial encounter: Secondary | ICD-10-CM | POA: Insufficient documentation

## 2015-04-12 DIAGNOSIS — Y990 Civilian activity done for income or pay: Secondary | ICD-10-CM | POA: Diagnosis not present

## 2015-04-12 DIAGNOSIS — M25511 Pain in right shoulder: Secondary | ICD-10-CM | POA: Diagnosis present

## 2015-04-12 DIAGNOSIS — E785 Hyperlipidemia, unspecified: Secondary | ICD-10-CM | POA: Insufficient documentation

## 2015-04-12 DIAGNOSIS — X58XXXA Exposure to other specified factors, initial encounter: Secondary | ICD-10-CM | POA: Insufficient documentation

## 2015-04-12 DIAGNOSIS — S46111A Strain of muscle, fascia and tendon of long head of biceps, right arm, initial encounter: Secondary | ICD-10-CM | POA: Diagnosis not present

## 2015-04-12 DIAGNOSIS — Z87891 Personal history of nicotine dependence: Secondary | ICD-10-CM | POA: Insufficient documentation

## 2015-04-12 DIAGNOSIS — Z5333 Arthroscopic surgical procedure converted to open procedure: Secondary | ICD-10-CM | POA: Insufficient documentation

## 2015-04-12 HISTORY — PX: BICEPT TENODESIS: SHX5116

## 2015-04-12 HISTORY — PX: SHOULDER ARTHROSCOPY WITH SUBACROMIAL DECOMPRESSION: SHX5684

## 2015-04-12 HISTORY — PX: SHOULDER ARTHROSCOPY WITH DISTAL CLAVICLE RESECTION: SHX5675

## 2015-04-12 SURGERY — SHOULDER ARTHROSCOPY WITH SUBACROMIAL DECOMPRESSION
Anesthesia: General | Site: Shoulder | Laterality: Right

## 2015-04-12 MED ORDER — OXYCODONE-ACETAMINOPHEN 5-325 MG PO TABS: 1.0000 | ORAL_TABLET | ORAL | Status: DC | PRN

## 2015-04-12 MED ORDER — MIDAZOLAM HCL 2 MG/2ML IJ SOLN
1.0000 mg | INTRAMUSCULAR | Status: DC | PRN
  Administered 2015-04-12: 2 mg via INTRAVENOUS

## 2015-04-12 MED ORDER — DEXAMETHASONE SODIUM PHOSPHATE 4 MG/ML IJ SOLN
INTRAMUSCULAR | Status: DC | PRN
  Administered 2015-04-12: 10 mg via INTRAVENOUS

## 2015-04-12 MED ORDER — ONDANSETRON HCL 4 MG/2ML IJ SOLN
INTRAMUSCULAR | Status: AC
  Filled 2015-04-12: qty 2

## 2015-04-12 MED ORDER — PROMETHAZINE HCL 25 MG/ML IJ SOLN: 6.2500 mg | INTRAMUSCULAR | Status: DC | PRN

## 2015-04-12 MED ORDER — CEFAZOLIN SODIUM-DEXTROSE 2-3 GM-% IV SOLR
INTRAVENOUS | Status: AC
  Filled 2015-04-12: qty 50

## 2015-04-12 MED ORDER — PROPOFOL 10 MG/ML IV BOLUS
INTRAVENOUS | Status: DC | PRN
  Administered 2015-04-12: 20 mg via INTRAVENOUS
  Administered 2015-04-12: 200 mg via INTRAVENOUS

## 2015-04-12 MED ORDER — POVIDONE-IODINE 7.5 % EX SOLN: Freq: Once | CUTANEOUS | Status: DC

## 2015-04-12 MED ORDER — FENTANYL CITRATE (PF) 100 MCG/2ML IJ SOLN
INTRAMUSCULAR | Status: AC
  Filled 2015-04-12: qty 2

## 2015-04-12 MED ORDER — DEXAMETHASONE SODIUM PHOSPHATE 10 MG/ML IJ SOLN
INTRAMUSCULAR | Status: AC
  Filled 2015-04-12: qty 1

## 2015-04-12 MED ORDER — BUPIVACAINE-EPINEPHRINE (PF) 0.5% -1:200000 IJ SOLN
INTRAMUSCULAR | Status: DC | PRN
  Administered 2015-04-12: 30 mL

## 2015-04-12 MED ORDER — PHENYLEPHRINE HCL 10 MG/ML IJ SOLN
INTRAMUSCULAR | Status: DC | PRN
  Administered 2015-04-12: 40 ug via INTRAVENOUS
  Administered 2015-04-12: 80 ug via INTRAVENOUS
  Administered 2015-04-12: 40 ug via INTRAVENOUS

## 2015-04-12 MED ORDER — SCOPOLAMINE 1 MG/3DAYS TD PT72: 1.0000 | MEDICATED_PATCH | Freq: Once | TRANSDERMAL | Status: DC | PRN

## 2015-04-12 MED ORDER — LACTATED RINGERS IV SOLN
INTRAVENOUS | Status: DC
  Administered 2015-04-12 (×2): via INTRAVENOUS

## 2015-04-12 MED ORDER — SUCCINYLCHOLINE CHLORIDE 20 MG/ML IJ SOLN
INTRAMUSCULAR | Status: DC | PRN
  Administered 2015-04-12: 100 mg via INTRAVENOUS

## 2015-04-12 MED ORDER — MIDAZOLAM HCL 2 MG/2ML IJ SOLN
INTRAMUSCULAR | Status: AC
  Filled 2015-04-12: qty 2

## 2015-04-12 MED ORDER — FENTANYL CITRATE (PF) 100 MCG/2ML IJ SOLN
50.0000 ug | INTRAMUSCULAR | Status: DC | PRN
  Administered 2015-04-12 (×2): 50 ug via INTRAVENOUS

## 2015-04-12 MED ORDER — DOCUSATE SODIUM 100 MG PO CAPS: 100.0000 mg | ORAL_CAPSULE | Freq: Three times a day (TID) | ORAL | Status: DC | PRN

## 2015-04-12 MED ORDER — CEFAZOLIN SODIUM-DEXTROSE 2-3 GM-% IV SOLR
2.0000 g | INTRAVENOUS | Status: AC
  Administered 2015-04-12: 2 g via INTRAVENOUS

## 2015-04-12 MED ORDER — GLYCOPYRROLATE 0.2 MG/ML IJ SOLN: 0.2000 mg | Freq: Once | INTRAMUSCULAR | Status: DC | PRN

## 2015-04-12 MED ORDER — HYDROMORPHONE HCL 1 MG/ML IJ SOLN: 0.2500 mg | INTRAMUSCULAR | Status: DC | PRN

## 2015-04-12 MED ORDER — LIDOCAINE HCL (CARDIAC) 20 MG/ML IV SOLN
INTRAVENOUS | Status: DC | PRN
  Administered 2015-04-12: 80 mg via INTRAVENOUS

## 2015-04-12 SURGICAL SUPPLY — 83 items
BENZOIN TINCTURE PRP APPL 2/3 (GAUZE/BANDAGES/DRESSINGS) IMPLANT
BLADE CLIPPER SURG (BLADE) IMPLANT
BLADE SURG 15 STRL LF DISP TIS (BLADE) IMPLANT
BLADE SURG 15 STRL SS (BLADE)
BLADE VORTEX 6.0 (BLADE) IMPLANT
BUR OVAL 4.0 (BURR) ×2 IMPLANT
CANNULA 5.75X71 LONG (CANNULA) ×2 IMPLANT
CANNULA TWIST IN 8.25X7CM (CANNULA) IMPLANT
CHLORAPREP W/TINT 26ML (MISCELLANEOUS) ×2 IMPLANT
DECANTER SPIKE VIAL GLASS SM (MISCELLANEOUS) IMPLANT
DRAPE INCISE IOBAN 66X45 STRL (DRAPES) ×2 IMPLANT
DRAPE STERI 35X30 U-POUCH (DRAPES) ×2 IMPLANT
DRAPE SURG 17X23 STRL (DRAPES) ×2 IMPLANT
DRAPE U 20/CS (DRAPES) ×2 IMPLANT
DRAPE U-SHAPE 47X51 STRL (DRAPES) ×2 IMPLANT
DRAPE U-SHAPE 76X120 STRL (DRAPES) ×4 IMPLANT
DRSG OPSITE 6X11 MED (GAUZE/BANDAGES/DRESSINGS) ×2 IMPLANT
DRSG PAD ABDOMINAL 8X10 ST (GAUZE/BANDAGES/DRESSINGS) ×2 IMPLANT
ELECT BLADE 4.0 EZ CLEAN MEGAD (MISCELLANEOUS)
ELECT REM PT RETURN 9FT ADLT (ELECTROSURGICAL) ×2
ELECTRODE BLDE 4.0 EZ CLN MEGD (MISCELLANEOUS) IMPLANT
ELECTRODE REM PT RTRN 9FT ADLT (ELECTROSURGICAL) ×1 IMPLANT
GAUZE SPONGE 4X4 12PLY STRL (GAUZE/BANDAGES/DRESSINGS) ×2 IMPLANT
GAUZE SPONGE 4X4 16PLY XRAY LF (GAUZE/BANDAGES/DRESSINGS) IMPLANT
GAUZE XEROFORM 1X8 LF (GAUZE/BANDAGES/DRESSINGS) ×2 IMPLANT
GLOVE BIO SURGEON STRL SZ7 (GLOVE) ×2 IMPLANT
GLOVE BIO SURGEON STRL SZ7.5 (GLOVE) ×2 IMPLANT
GLOVE BIOGEL PI IND STRL 7.0 (GLOVE) ×1 IMPLANT
GLOVE BIOGEL PI IND STRL 8 (GLOVE) ×1 IMPLANT
GLOVE BIOGEL PI INDICATOR 7.0 (GLOVE) ×1
GLOVE BIOGEL PI INDICATOR 8 (GLOVE) ×1
GLOVE ECLIPSE 6.5 STRL STRAW (GLOVE) ×4 IMPLANT
GOWN STRL REUS W/ TWL LRG LVL3 (GOWN DISPOSABLE) ×2 IMPLANT
GOWN STRL REUS W/TWL LRG LVL3 (GOWN DISPOSABLE) ×2
GOWN STRL REUS W/TWL XL LVL3 (GOWN DISPOSABLE) ×2 IMPLANT
LASSO CRESCENT QUICKPASS (SUTURE) ×2 IMPLANT
LIQUID BAND (GAUZE/BANDAGES/DRESSINGS) IMPLANT
MANIFOLD NEPTUNE II (INSTRUMENTS) ×2 IMPLANT
NDL SUT 6 .5 CRC .975X.05 MAYO (NEEDLE) IMPLANT
NEEDLE 1/2 CIR CATGUT .05X1.09 (NEEDLE) IMPLANT
NEEDLE MAYO TAPER (NEEDLE)
NEEDLE SCORPION MULTI FIRE (NEEDLE) IMPLANT
NS IRRIG 1000ML POUR BTL (IV SOLUTION) IMPLANT
PACK ARTHROSCOPY DSU (CUSTOM PROCEDURE TRAY) ×2 IMPLANT
PACK BASIN DAY SURGERY FS (CUSTOM PROCEDURE TRAY) ×2 IMPLANT
PENCIL BUTTON HOLSTER BLD 10FT (ELECTRODE) ×2 IMPLANT
RESECTOR FULL RADIUS 4.2MM (BLADE) ×2 IMPLANT
SLEEVE SCD COMPRESS KNEE MED (MISCELLANEOUS) ×2 IMPLANT
SLING ARM FOAM STRAP LRG (SOFTGOODS) IMPLANT
SLING ARM IMMOBILIZER MED (SOFTGOODS) IMPLANT
SLING ARM MED ADULT FOAM STRAP (SOFTGOODS) IMPLANT
SLING ARM XL FOAM STRAP (SOFTGOODS) IMPLANT
SPONGE LAP 4X18 X RAY DECT (DISPOSABLE) ×2 IMPLANT
STRIP CLOSURE SKIN 1/2X4 (GAUZE/BANDAGES/DRESSINGS) ×2 IMPLANT
SUCTION FRAZIER HANDLE 10FR (MISCELLANEOUS)
SUCTION TUBE FRAZIER 10FR DISP (MISCELLANEOUS) IMPLANT
SUPPORT WRAP ARM LG (MISCELLANEOUS) ×2 IMPLANT
SUT 2 FIBERLOOP 20 STRT BLUE (SUTURE) ×2
SUT BONE WAX W31G (SUTURE) IMPLANT
SUT ETHIBOND 2 OS 4 DA (SUTURE) IMPLANT
SUT ETHILON 3 0 PS 1 (SUTURE) ×2 IMPLANT
SUT ETHILON 4 0 PS 2 18 (SUTURE) IMPLANT
SUT FIBERWIRE #2 38 T-5 BLUE (SUTURE)
SUT MNCRL AB 3-0 PS2 18 (SUTURE) IMPLANT
SUT MNCRL AB 4-0 PS2 18 (SUTURE) ×2 IMPLANT
SUT PDS AB 0 CT 36 (SUTURE) IMPLANT
SUT PROLENE 3 0 PS 2 (SUTURE) IMPLANT
SUT TIGER TAPE 7 IN WHITE (SUTURE) IMPLANT
SUT VIC AB 0 CT1 27 (SUTURE)
SUT VIC AB 0 CT1 27XBRD ANBCTR (SUTURE) IMPLANT
SUT VIC AB 2-0 SH 27 (SUTURE) ×1
SUT VIC AB 2-0 SH 27XBRD (SUTURE) ×1 IMPLANT
SUTURE 2 FIBERLOOP 20 STRT BLU (SUTURE) ×1 IMPLANT
SUTURE FIBERWR #2 38 T-5 BLUE (SUTURE) IMPLANT
SYR BULB 3OZ (MISCELLANEOUS) IMPLANT
TAPE FIBER 2MM 7IN #2 BLUE (SUTURE) IMPLANT
TOWEL OR 17X24 6PK STRL BLUE (TOWEL DISPOSABLE) ×2 IMPLANT
TOWEL OR NON WOVEN STRL DISP B (DISPOSABLE) ×2 IMPLANT
TUBE CONNECTING 20X1/4 (TUBING) ×2 IMPLANT
TUBING ARTHROSCOPY IRRIG 16FT (MISCELLANEOUS) ×2 IMPLANT
WAND STAR VAC 90 (SURGICAL WAND) ×2 IMPLANT
WATER STERILE IRR 1000ML POUR (IV SOLUTION) ×2 IMPLANT
YANKAUER SUCT BULB TIP NO VENT (SUCTIONS) ×2 IMPLANT

## 2015-04-12 NOTE — Progress Notes (Signed)
Assisted Dr. Denenny with right, ultrasound guided, interscalene  block. Side rails up, monitors on throughout procedure. See vital signs in flow sheet. Tolerated Procedure well. 

## 2015-04-12 NOTE — Anesthesia Procedure Notes (Addendum)
Anesthesia Regional Block:  Interscalene brachial plexus block  Pre-Anesthetic Checklist: ,, timeout performed, Correct Patient, Correct Site, Correct Laterality, Correct Procedure, Correct Position, site marked, Risks and benefits discussed,  Surgical consent,  Pre-op evaluation,  At surgeon's request and post-op pain management  Laterality: Right and Upper  Prep: chloraprep       Needles:  Injection technique: Single-shot     Needle Length: 10cm 10 cm Needle Gauge: 21 and 21 G    Additional Needles:  Procedures: ultrasound guided (picture in chart) Interscalene brachial plexus block Narrative:  Injection made incrementally with aspirations every 5 mL.  Performed by: Personally  Anesthesiologist: Sherrian Divers  Additional Notes: Right interscalene block. No pain with injection. No increased resistance to injection. Tolerated the block well. Meaningful verbal contact maintained throughout block placement. Motor intact immediately after block. Loss of deltoid function at 20 minutes.   Procedure Name: Intubation Date/Time: 04/12/2015 1:00 PM Performed by: Burna Cash Pre-anesthesia Checklist: Patient identified, Emergency Drugs available, Suction available and Patient being monitored Patient Re-evaluated:Patient Re-evaluated prior to inductionOxygen Delivery Method: Circle System Utilized Preoxygenation: Pre-oxygenation with 100% oxygen Intubation Type: IV induction Ventilation: Mask ventilation without difficulty Laryngoscope Size: Mac and 3 Grade View: Grade II Tube type: Oral Tube size: 8.0 mm Number of attempts: 1 Airway Equipment and Method: Stylet and Oral airway Placement Confirmation: ETT inserted through vocal cords under direct vision,  positive ETCO2 and breath sounds checked- equal and bilateral Secured at: 21 cm Tube secured with: Tape Dental Injury: Teeth and Oropharynx as per pre-operative assessment

## 2015-04-12 NOTE — Anesthesia Preprocedure Evaluation (Addendum)
Anesthesia Evaluation  Patient identified by MRN, date of birth, ID band Patient awake    Reviewed: Allergy & Precautions, NPO status , Patient's Chart, lab work & pertinent test results  Airway Mallampati: II  TM Distance: >3 FB Neck ROM: Full    Dental no notable dental hx.    Pulmonary former smoker,    Pulmonary exam normal breath sounds clear to auscultation       Cardiovascular negative cardio ROS Normal cardiovascular exam Rhythm:Regular Rate:Normal     Neuro/Psych negative neurological ROS  negative psych ROS   GI/Hepatic negative GI ROS, Neg liver ROS,   Endo/Other  negative endocrine ROS  Renal/GU negative Renal ROS  negative genitourinary   Musculoskeletal negative musculoskeletal ROS (+)   Abdominal   Peds negative pediatric ROS (+)  Hematology negative hematology ROS (+)   Anesthesia Other Findings   Reproductive/Obstetrics negative OB ROS                             Anesthesia Physical Anesthesia Plan  ASA: I  Anesthesia Plan: General   Post-op Pain Management: GA combined w/ Regional for post-op pain   Induction: Intravenous  Airway Management Planned: Oral ETT  Additional Equipment:   Intra-op Plan:   Post-operative Plan: Extubation in OR  Informed Consent: I have reviewed the patients History and Physical, chart, labs and discussed the procedure including the risks, benefits and alternatives for the proposed anesthesia with the patient or authorized representative who has indicated his/her understanding and acceptance.   Dental advisory given  Plan Discussed with: CRNA  Anesthesia Plan Comments: (Discussed risks and benefits of interscalene block including failure, bleeding, infection, nerve damage, weakness. Questions answered. Patient consents to block.)       Anesthesia Quick Evaluation

## 2015-04-12 NOTE — Transfer of Care (Signed)
Immediate Anesthesia Transfer of Care Note  Patient: Dennis Mclaughlin  Procedure(s) Performed: Procedure(s) with comments: SHOULDER ARTHROSCOPY WITH SUBACROMIAL DECOMPRESSION, DISTAL CLAVICLE RESECTION, DEBRIDEMENT LABRAL TEAR, OPEN BICEPS TENODESIS (Right) - Right shoulder arthroscopy with subacromial decompression, distal clavical resection, debridement labral tear, possible biceps tenodesis.  Patient Location: PACU  Anesthesia Type:General and GA combined with regional for post-op pain  Level of Consciousness: awake and alert   Airway & Oxygen Therapy: Patient Spontanous Breathing and Patient connected to face mask oxygen  Post-op Assessment: Report given to RN and Post -op Vital signs reviewed and stable  Post vital signs: Reviewed and stable  Last Vitals:  Filed Vitals:   04/12/15 1210 04/12/15 1215  BP: 127/80 121/87  Pulse: 93 92  Temp:    Resp: 16 16    Complications: No apparent anesthesia complications

## 2015-04-12 NOTE — Anesthesia Postprocedure Evaluation (Signed)
Anesthesia Post Note  Patient: Dennis Mclaughlin  Procedure(s) Performed: Procedure(s) (LRB): SHOULDER ARTHROSCOPY WITH SUBACROMIAL DECOMPRESSION, DISTAL CLAVICLE RESECTION, DEBRIDEMENT LABRAL TEAR, OPEN BICEPS TENODESIS (Right)  Patient location during evaluation: PACU Anesthesia Type: General and Regional Level of consciousness: awake and alert Pain management: pain level controlled Vital Signs Assessment: post-procedure vital signs reviewed and stable Respiratory status: spontaneous breathing, nonlabored ventilation, respiratory function stable and patient connected to nasal cannula oxygen Cardiovascular status: blood pressure returned to baseline and stable Postop Assessment: no signs of nausea or vomiting Anesthetic complications: no    Last Vitals:  Filed Vitals:   04/12/15 1430 04/12/15 1515  BP: 149/90 149/91  Pulse: 94 100  Temp:  36.5 C  Resp: 14 16    Last Pain:  Filed Vitals:   04/12/15 1522  PainSc: 1                  Lovetta Condie J

## 2015-04-12 NOTE — Op Note (Signed)
Procedure(s):   OLANDER FRIEDL male 65 y.o. 04/12/2015  Procedure(s) and Anesthesia Type: #1 right shoulder arthroscopic debridement proximal long head biceps tendon tear with tenotomy and debridement partial thickness supraspinatus tear #2 right shoulder arthroscopic subacromial decompression/bursectomy #3 right shoulder arthroscopic distal clavicle excision #4 right shoulder open subpectoral biceps tenodesis  Surgeon(s) and Role:    * Jones Broom, MD - Primary     Surgeon: Mable Paris   Assistants: Damita Lack PA-C (Danielle was present and scrubbed throughout the procedure and was essential in positioning, assisting with the camera and instrumentation,, and closure)  Anesthesia: General endotracheal anesthesiawith preoperative interscalene block given by the attending anesthesiologist    Procedure Detail  SHOULDER ARTHROSCOPY WITH SUBACROMIAL DECOMPRESSION, DISTAL CLAVICLE RESECTION, DEBRIDEMENT LABRAL TEAR, OPEN BICEPS TENODESIS  Estimated Blood Loss: Min         Drains: none  Blood Given: none         Specimens: none        Complications:  * No complications entered in OR log *         Disposition: PACU - hemodynamically stable.         Condition: stable    Procedure:   INDICATIONS FOR SURGERY: The patient is 65 y.o. male who was injured at work back in August. He had persistent pain in the shoulder despite conservative management. Ultimately he was indicated for surgical treatment to try and decrease pain and restore function.  OPERATIVE FINDINGS: Examination under anesthesia: No stiffness or instability  DESCRIPTION OF PROCEDURE: The patient was identified in preoperative  holding area where I personally marked the operative site after  verifying site, side, and procedure with the patient. An interscalene block was given by the attending anesthesiologist the holding area.  The patient was taken back to the operating room where  general anesthesia was induced without complication and was placed in the beach-chair position with the back  elevated about 60 degrees and all extremities and head and neck carefully padded and  positioned.   The right upper extremity was then prepped and  draped in a standard sterile fashion. The appropriate time-out  procedure was carried out. The patient did receive IV antibiotics  within 30 minutes of incision.   A small posterior portal incision was made and the arthroscope was introduced into the joint. An anterior portal was then established above the subscapularis using needle localization. Small cannula was placed anteriorly. Diagnostic arthroscopy was then carried out.  He was noted to have extensive tearing of the proximal head biceps tendon extending into the origin of the biceps off of the superior labrum. This involves greater than 50% of the thickness of the tendon. It was debrided with a shaver and ultimately the decision was made to proceed with biceps tenotomy. The ArthroCare was used to release the proximal biceps off the superior labrum and then the origin was debrided with shaver. The subscapularis was intact. The articular surfaces showed no significant chondromalacia. The articular sided supraspinatus did have some mild partial tearing involving about 10% of the inner surface. This was debrided with a shaver back to healthy tendon.  The arthroscope was then introduced into the subacromial space a standard lateral portal was established with needle localization. The shaver was used through the lateral portal to perform extensive bursectomy. 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  The bursal surface of the rotator cuff was carefully examined and found to have mild fraying anteriorly in  the region of impingement but no partial or full tearing.  The coracoacromial ligament was taken down off the anterior acromion with the  ArthroCare exposing a moderate hooked anterior acromial spur. A high-speed bur was then used through the lateral portal to take down the anterior acromial spur from lateral to medial in a standard acromioplasty.  The acromioplasty was also viewed from the lateral portal and the bur was used as necessary to ensure that the acromion was completely flat from posterior to anterior.  The distal clavicle was exposed arthroscopically and the bur was used to take off the undersurface for approximately 8 mm from the lateral portal. The bur was then moved to an anterior portal position to complete the distal clavicle excision resecting about 8 mm of the distal clavicle and a smooth even fashion. This was viewed from anterior and lateral portals and felt to be complete.   Attention was then turned to the axilla where a approximately 3 cm incision was made in the dominant axillary fold. This was about 50% above and 50% below the palpable lower border of the pectoralis major. Dissection was carried out between the lower border of the pectoralis major and the short head of the biceps muscle belly. The anterior humerus was then exposed and the long head biceps was delivered out through the wound. The biceps was prepared using a #2 FiberWire fiber loop and the remaining portion of the biceps tendon was discarded after choosing the appropriate tension and length. A drill bit slightly smaller than the tendon was used in the distal bicipital groove to create an intramedullary hole and then a drill bit about 12 mm distal to that was used which was slightly larger than the suture passer needle. A crescent suture lasso was then used to pass the sutures from proximal to distal and then one suture was brought around medial and lateral to the tendon. It was tensioned, dunking the tendon into the intramedullary canal and tied over the anterior portion of the tendon. The tension was felt to be appropriate. The wound was copiously  irrigated with normal saline and subsequently closed in layers with 2-0 Vicryl in the deep dermal layer and Dermabond for skin closure.  The arthroscopic equipment was removed from the joint and the portals were closed with 3-0 nylon in an interrupted fashion. Sterile dressings were then applied including Xeroform 4 x 4's ABDs and tape. The patient was then allowed to awaken from general anesthesia, placed in a sling, transferred to the stretcher and taken to the recovery room in stable condition.   POSTOPERATIVE PLAN: The patient will be discharged home today and will followup in one week for suture removal and wound check.  He will not have elbow flexion against resistance for the first 6 weeks.

## 2015-04-12 NOTE — Discharge Instructions (Signed)
Discharge Instructions after Arthroscopic Shoulder Surgery   A sling has been provided for you. Wear your sling at all times. Use ice on the shoulder intermittently over the first 48 hours after surgery.  Pain medication has been prescribed for you.  Use your medication liberally over the first 48 hours, and then begin to taper your use. You may take Extra Strength Tylenol or Tylenol only in place of the pain pills. DO NOT take ANY nonsteroidal anti-inflammatory pain medications: Advil, Motrin, Ibuprofen, Aleve, Naproxen, or Naprosyn.  You may remove your dressing after two days.  You may shower 5 days after surgery. The incision CANNOT get wet prior to 5 days. Simply allow the water to wash over the site and then pat dry. Do not rub the incision. Make sure your axilla (armpit) is completely dry after showering.  Take one aspirin a day for 2 weeks after surgery, unless you have an aspirin sensitivity/allergy or asthma.  Three to 5 times each day you should perform assisted overhead reaching and external rotation (outward turning) exercises with the operative arm. Both exercises should be done with the non-operative arm used as the "therapist arm" while the operative arm remains relaxed. Ten of each exercise should be done three to five times each day. Do not pick up any weight and flex at the elbow ( like a bicep curl)     Overhead reach is helping to lift your stiff arm up as high as it will go. To stretch your overhead reach, lie flat on your back, relax, and grasp the wrist of the tight shoulder with your opposite hand. Using the power in your opposite arm, bring the stiff arm up as far as it is comfortable. Start holding it for ten seconds and then work up to where you can hold it for a count of 30. Breathe slowly and deeply while the arm is moved. Repeat this stretch ten times, trying to help the arm up a little higher each time.       External rotation is turning the arm out to the  side while your elbow stays close to your body. External rotation is best stretched while you are lying on your back. Hold a cane, yardstick, broom handle, or dowel in both hands. Bend both elbows to a right angle. Use steady, gentle force from your normal arm to rotate the hand of the stiff shoulder out away from your body. Continue the rotation as far as it will go comfortably, holding it there for a count of 10. Repeat this exercise ten times.     Please call [phone] during normal business hours or [phone] after hours for any problems. Including the following:  - excessive redness of the incisions - drainage for more than 4 days - fever of more than 101.5 F  *Please note that pain medications will not be refilled after hours or on weekends.    Post Anesthesia Home Care Instructions  Activity: Get plenty of rest for the remainder of the day. A responsible adult should stay with you for 24 hours following the procedure.  For the next 24 hours, DO NOT: -Drive a car -Advertising copywriter -Drink alcoholic beverages -Take any medication unless instructed by your physician -Make any legal decisions or sign important papers.  Meals: Start with liquid foods such as gelatin or soup. Progress to regular foods as tolerated. Avoid greasy, spicy, heavy foods. If nausea and/or vomiting occur, drink only clear liquids until the nausea and/or vomiting subsides. Call  your physician if vomiting continues.  Special Instructions/Symptoms: Your throat may feel dry or sore from the anesthesia or the breathing tube placed in your throat during surgery. If this causes discomfort, gargle with warm salt water. The discomfort should disappear within 24 hours.  If you had a scopolamine patch placed behind your ear for the management of post- operative nausea and/or vomiting:  1. The medication in the patch is effective for 72 hours, after which it should be removed.  Wrap patch in a tissue and discard  in the trash. Wash hands thoroughly with soap and water. 2. You may remove the patch earlier than 72 hours if you experience unpleasant side effects which may include dry mouth, dizziness or visual disturbances. 3. Avoid touching the patch. Wash your hands with soap and water after contact with the patch.      Regional Anesthesia Blocks  1. Numbness or the inability to move the "blocked" extremity may last from 3-48 hours after placement. The length of time depends on the medication injected and your individual response to the medication. If the numbness is not going away after 48 hours, call your surgeon.  2. The extremity that is blocked will need to be protected until the numbness is gone and the  Strength has returned. Because you cannot feel it, you will need to take extra care to avoid injury. Because it may be weak, you may have difficulty moving it or using it. You may not know what position it is in without looking at it while the block is in effect.  3. For blocks in the legs and feet, returning to weight bearing and walking needs to be done carefully. You will need to wait until the numbness is entirely gone and the strength has returned. You should be able to move your leg and foot normally before you try and bear weight or walk. You will need someone to be with you when you first try to ensure you do not fall and possibly risk injury.  4. Bruising and tenderness at the needle site are common side effects and will resolve in a few days.  5. Persistent numbness or new problems with movement should be communicated to the surgeon or the [facility] [id number] [facility] [phone]).

## 2015-04-12 NOTE — H&P (Signed)
Dennis Mclaughlin is an 65 y.o. male.   Chief Complaint: Right shoulder pain after injury at work HPI: 65 year old gentleman who was injured at work August 2016. He had persistent pain with MRI findings of rotator cuff tendinosis marked hypertrophy of the before meals joint, subdeltoid bursitis and partial tearing of the long head biceps tendon. Ultimately he was indicated for surgical treatment to try and decrease pain and restore function.  Past Medical History  Diagnosis Date  . Hyperlipidemia   . History of prostatitis   . Low testosterone     Past Surgical History  Procedure Laterality Date  . Colonoscopy w/ biopsies      History reviewed. No pertinent family history. Social History:  reports that he quit smoking about 25 years ago. His smoking use included Cigarettes. He has never used smokeless tobacco. He reports that he drinks alcohol. He reports that he does not use illicit drugs.  Allergies: No Known Allergies  Medications Prior to Admission  Medication Sig Dispense Refill  . atorvastatin (LIPITOR) 10 MG tablet Take 1 tablet (10 mg total) by mouth daily. 90 tablet 4  . ibuprofen (ADVIL,MOTRIN) 800 MG tablet Take 1 tablet (800 mg total) by mouth 3 (three) times daily. (Patient taking differently: Take 800 mg by mouth every 8 (eight) hours as needed. ) 30 tablet 0  . tamsulosin (FLOMAX) 0.4 MG CAPS capsule Take 0.4 mg by mouth.      No results found for this or any previous visit (from the past 48 hour(s)). No results found.  Review of Systems  All other systems reviewed and are negative.   Blood pressure 127/80, pulse 93, temperature 98.5 F (36.9 C), temperature source Oral, resp. rate 16, height  (1.778 m), weight 75.297 kg (166 lb), SpO2 100 %. Physical Exam  Constitutional: He is oriented to person, place, and time. He appears well-developed and well-nourished.  HENT:  Head: Atraumatic.  Eyes: EOM are normal.  Cardiovascular: Intact distal pulses.    Respiratory: Effort normal.  Musculoskeletal:  Right shoulder with tenderness over the supraspinatus insertion and acromioclavicular joint.  Neurological: He is alert and oriented to person, place, and time.  Skin: Skin is warm and dry.  Psychiatric: He has a normal mood and affect.     Assessment/Plan Right shoulder injury at work, failed conservative management Plan right shoulder arthroscopic subacromial decompression, bursectomy, distal clavicle excision, possible biceps debridement versus tenodesis. Risks / benefits of surgery discussed Consent on chart  NPO for OR Preop antibiotics   Mable Paris, MD 04/12/2015, 12:18 PM

## 2015-04-13 ENCOUNTER — Encounter (HOSPITAL_BASED_OUTPATIENT_CLINIC_OR_DEPARTMENT_OTHER): Payer: Self-pay | Admitting: Orthopedic Surgery

## 2015-06-03 DIAGNOSIS — N401 Enlarged prostate with lower urinary tract symptoms: Secondary | ICD-10-CM | POA: Diagnosis not present

## 2015-06-03 DIAGNOSIS — N138 Other obstructive and reflux uropathy: Secondary | ICD-10-CM | POA: Diagnosis not present

## 2015-06-03 DIAGNOSIS — N529 Male erectile dysfunction, unspecified: Secondary | ICD-10-CM | POA: Diagnosis not present

## 2015-06-03 DIAGNOSIS — R339 Retention of urine, unspecified: Secondary | ICD-10-CM | POA: Diagnosis not present

## 2015-06-03 DIAGNOSIS — E291 Testicular hypofunction: Secondary | ICD-10-CM | POA: Diagnosis not present

## 2015-07-12 ENCOUNTER — Ambulatory Visit (INDEPENDENT_AMBULATORY_CARE_PROVIDER_SITE_OTHER): Payer: BLUE CROSS/BLUE SHIELD | Admitting: Family Medicine

## 2015-07-12 ENCOUNTER — Encounter: Payer: Self-pay | Admitting: Family Medicine

## 2015-07-12 VITALS — BP 121/78 | HR 89 | Temp 97.9°F | Ht 70.1 in | Wt 172.0 lb

## 2015-07-12 DIAGNOSIS — N138 Other obstructive and reflux uropathy: Secondary | ICD-10-CM

## 2015-07-12 DIAGNOSIS — N401 Enlarged prostate with lower urinary tract symptoms: Secondary | ICD-10-CM

## 2015-07-12 DIAGNOSIS — E785 Hyperlipidemia, unspecified: Secondary | ICD-10-CM | POA: Diagnosis not present

## 2015-07-12 NOTE — Assessment & Plan Note (Signed)
The current medical regimen is effective;  continue present plan and medications.  

## 2015-07-12 NOTE — Progress Notes (Signed)
BP 121/78 mmHg  Pulse 89  Temp(Src) 97.9 F (36.6 C)  Ht 5' 10.1" (1.781 m)  Wt 172 lb (78.019 kg)  BMI 24.60 kg/m2  SpO2 96%   Subjective:    Patient ID: Dennis Mclaughlin, male    DOB: [birth year], 65 y.o.   MRN: [id number]  HPI: Dennis Mclaughlin is a 64 y.o. male  Chief Complaint  Patient presents with  . Hyperlipidemia    patient had labs done at work, will bring results back to [place]  Patient doing well with cholesterol medicine Lipitor takes without side effects takes faithfully had blood work done at work with good results on liver for safety blood work was not done. Patient doing well with tamsulosin with BPH symptoms doing well is followed by urology. for low testosterone at present time selected for no replacement  Relevant past medical, surgical, family and social history reviewed and updated as indicated. Interim medical history since our last visit reviewed. Allergies and medications reviewed and updated.  Review of Systems  Constitutional: Negative.   Respiratory: Negative.   Cardiovascular: Negative.     Per HPI unless specifically indicated above     Objective:    BP 121/78 mmHg  Pulse 89  Temp(Src) 97.9 F (36.6 C)  Ht 5' 10.1" (1.781 m)  Wt 172 lb (78.019 kg)  BMI 24.60 kg/m2  SpO2 96%  Wt Readings from Last 3 Encounters:  07/12/15 172 lb (78.019 kg)  04/12/15 166 lb (75.297 kg)  02/04/15 170 lb (77.111 kg)    Physical Exam  Constitutional: He is oriented to person, place, and time. He appears well-developed and well-nourished. No distress.  HENT:  Head: Normocephalic and atraumatic.  Right Ear: Hearing normal.  Left Ear: Hearing normal.  Nose: Nose normal.  Eyes: Conjunctivae and lids are normal. Right eye exhibits no discharge. Left eye exhibits no discharge. No scleral icterus.  Cardiovascular: Normal rate, regular rhythm and normal heart sounds.   Pulmonary/Chest: Effort normal and breath sounds normal. No respiratory distress.   Musculoskeletal: Normal range of motion.  Neurological: He is alert and oriented to person, place, and time.  Skin: Skin is intact. No rash noted.  Psychiatric: He has a normal mood and affect. His speech is normal and behavior is normal. Judgment and thought content normal. Cognition and memory are normal.    Results for orders placed or performed in visit on 01/12/15  Comprehensive metabolic panel  Result Value Ref Range   Glucose 100 (H) 65 - 99 mg/dL   BUN 12 8 - 27 mg/dL   Creatinine, Ser 3.08 0.76 - 1.27 mg/dL   GFR calc non Af Amer 87 >59 mL/min/1.73   GFR calc Af Amer 100 >59 mL/min/1.73   BUN/Creatinine Ratio 13 10 - 22   Sodium 140 136 - 144 mmol/L   Potassium 4.3 3.5 - 5.2 mmol/L   Chloride 99 97 - 106 mmol/L   CO2 24 18 - 29 mmol/L   Calcium 9.4 8.6 - 10.2 mg/dL   Total Protein 6.5 6.0 - 8.5 g/dL   Albumin 4.6 3.6 - 4.8 g/dL   Globulin, Total 1.9 1.5 - 4.5 g/dL   Albumin/Globulin Ratio 2.4 1.1 - 2.5   Bilirubin Total 0.5 0.0 - 1.2 mg/dL   Alkaline Phosphatase 76 39 - 117 IU/L   AST 21 0 - 40 IU/L   ALT 17 0 - 44 IU/L  Lipid panel  Result Value Ref Range   Cholesterol, Total 271 (H) 100 -  199 mg/dL   Triglycerides 96 0 - 149 mg/dL   HDL 47 >16>39 mg/dL   VLDL Cholesterol Cal 19 5 - 40 mg/dL   LDL Calculated 109205 (H) 0 - 99 mg/dL   Chol/HDL Ratio 5.8 (H) 0.0 - 5.0 ratio units  CBC with Differential/Platelet  Result Value Ref Range   WBC 6.6 3.4 - 10.8 x10E3/uL   RBC 4.68 4.14 - 5.80 x10E6/uL   Hemoglobin 14.5 12.6 - 17.7 g/dL   Hematocrit 60.443.3 54.037.5 - 51.0 %   MCV 93 79 - 97 fL   MCH 31.0 26.6 - 33.0 pg   MCHC 33.5 31.5 - 35.7 g/dL   RDW 98.113.1 19.112.3 - 47.815.4 %   Platelets 232 150 - 379 x10E3/uL   Neutrophils 65 %   Lymphs 25 %   Monocytes 8 %   Eos 1 %   Basos 1 %   Neutrophils Absolute 4.3 1.4 - 7.0 x10E3/uL   Lymphocytes Absolute 1.7 0.7 - 3.1 x10E3/uL   Monocytes Absolute 0.5 0.1 - 0.9 x10E3/uL   EOS (ABSOLUTE) 0.1 0.0 - 0.4 x10E3/uL   Basophils Absolute  0.0 0.0 - 0.2 x10E3/uL   Immature Granulocytes 0 %   Immature Grans (Abs) 0.0 0.0 - 0.1 x10E3/uL  PSA  Result Value Ref Range   Prostate Specific Ag, Serum 1.1 0.0 - 4.0 ng/mL  Urinalysis, Routine w reflex microscopic (not [facility])  Result Value Ref Range   Specific Gravity, UA 1.015 1.005 - 1.030   pH, UA 7.5 5.0 - 7.5   Color, UA Yellow Yellow   Appearance Ur Clear Clear   Leukocytes, UA Negative Negative   Protein, UA Negative Negative/Trace   Glucose, UA Negative Negative   Ketones, UA Negative Negative   RBC, UA Negative Negative   Bilirubin, UA Negative Negative   Urobilinogen, Ur 0.2 0.2 - 1.0 mg/dL   Nitrite, UA Negative Negative  TSH  Result Value Ref Range   TSH 3.850 0.450 - 4.500 uIU/mL  Testosterone  Result Value Ref Range   Testosterone 180 (L) 348 - 1197 ng/dL   Comment, Testosterone Comment       Assessment & Plan:   Problem List Items Addressed This Visit      Genitourinary   BPH with obstruction/lower urinary tract symptoms    The current medical regimen is effective;  continue present plan and medications.         Other   Hyperlipidemia - Primary    The current medical regimen is effective;  continue present plan and medications.       Relevant Medications   aspirin EC 81 MG tablet   Other Relevant Orders   ALT   AST       Follow up plan: Return in about 6 months (around 01/12/2016) for Physical Exam.

## 2015-07-13 ENCOUNTER — Telehealth: Payer: Self-pay | Admitting: Family Medicine

## 2015-07-13 DIAGNOSIS — R748 Abnormal levels of other serum enzymes: Secondary | ICD-10-CM

## 2015-07-13 LAB — ALT: ALT: 39 IU/L (ref 0–44)

## 2015-07-13 LAB — AST: AST: 53 IU/L — AB (ref 0–40)

## 2015-07-13 NOTE — Telephone Encounter (Signed)
Phone call Discussed with patient elevated liver enzymes patient not doing liver toxic substances will recheck ALT, AST 1 month or so.

## 2015-09-13 ENCOUNTER — Other Ambulatory Visit: Payer: BLUE CROSS/BLUE SHIELD

## 2015-09-13 DIAGNOSIS — R748 Abnormal levels of other serum enzymes: Secondary | ICD-10-CM

## 2015-09-14 ENCOUNTER — Encounter: Payer: Self-pay | Admitting: Family Medicine

## 2015-09-14 LAB — ALT: ALT: 24 IU/L (ref 0–44)

## 2015-09-14 LAB — AST: AST: 20 IU/L (ref 0–40)

## 2015-10-12 ENCOUNTER — Encounter: Payer: Self-pay | Admitting: Family Medicine

## 2015-10-12 ENCOUNTER — Ambulatory Visit (INDEPENDENT_AMBULATORY_CARE_PROVIDER_SITE_OTHER): Payer: BLUE CROSS/BLUE SHIELD | Admitting: Family Medicine

## 2015-10-12 VITALS — BP 111/78 | HR 98 | Temp 98.0°F | Ht 69.5 in | Wt 168.0 lb

## 2015-10-12 DIAGNOSIS — J019 Acute sinusitis, unspecified: Secondary | ICD-10-CM | POA: Diagnosis not present

## 2015-10-12 MED ORDER — AMOXICILLIN-POT CLAVULANATE 875-125 MG PO TABS: 1.0000 | ORAL_TABLET | Freq: Two times a day (BID) | ORAL | 0 refills | Status: DC

## 2015-10-12 NOTE — Progress Notes (Signed)
BP 111/78 (BP Location: Left Arm, Patient Position: Sitting, Cuff Size: Small)   Pulse 98   Temp 98 F (36.7 C)   Ht 5' 9.5" (1.765 m)   Wt 168 lb (76.2 kg)   SpO2 98%   BMI 24.45 kg/m    Subjective:    Patient ID: Dennis Mclaughlin, male    DOB: 08/19/1950, 65 y.o.   MRN: [id number]  HPI: Dennis ByarsRonnie D Mclaughlin is a 65 y.o. male  Chief Complaint  Patient presents with  . URI    x 3 weeks  Patient with sinus pressure congestion and facial pain is been ongoing for 3 weeks tried multiple over-the-counter medications with no relief is also coughing a lot. Running fever starting this week and generalized systemic symptoms feeling bad. Sinus pressure sloshing sensation when bending over  Relevant past medical, surgical, family and social history reviewed and updated as indicated. Interim medical history since our last visit reviewed. Allergies and medications reviewed and updated.  Review of Systems  Constitutional: Positive for chills, fatigue and fever.  HENT: Positive for congestion, rhinorrhea, sinus pressure, sneezing and sore throat.   Respiratory: Positive for cough. Negative for shortness of breath and wheezing.   Cardiovascular: Negative for chest pain, palpitations and leg swelling.  Gastrointestinal: Negative for abdominal distention.    Per HPI unless specifically indicated above     Objective:    BP 111/78 (BP Location: Left Arm, Patient Position: Sitting, Cuff Size: Small)   Pulse 98   Temp 98 F (36.7 C)   Ht 5' 9.5" (1.765 m)   Wt 168 lb (76.2 kg)   SpO2 98%   BMI 24.45 kg/m   Wt Readings from Last 3 Encounters:  10/12/15 168 lb (76.2 kg)  07/12/15 172 lb (78 kg)  04/12/15 166 lb (75.3 kg)    Physical Exam  Constitutional: He is oriented to person, place, and time. He appears well-developed and well-nourished. No distress.  HENT:  Head: Normocephalic and atraumatic.  Right Ear: Hearing and external ear normal.  Left Ear: Hearing and external ear normal.    Nose: Nose normal.  Mouth/Throat: Oropharyngeal exudate present.  Eyes: Conjunctivae and lids are normal. Right eye exhibits no discharge. Left eye exhibits no discharge. No scleral icterus.  Cardiovascular: Normal rate, regular rhythm and normal heart sounds.   Pulmonary/Chest: Effort normal and breath sounds normal. No respiratory distress.  Musculoskeletal: Normal range of motion.  Neurological: He is alert and oriented to person, place, and time.  Skin: Skin is intact. No rash noted.  Psychiatric: He has a normal mood and affect. His speech is normal and behavior is normal. Judgment and thought content normal. Cognition and memory are normal.    Results for orders placed or performed in visit on 09/13/15  ALT  Result Value Ref Range   ALT 24 0 - 44 IU/L  AST  Result Value Ref Range   AST 20 0 - 40 IU/L      Assessment & Plan:   Problem List Items Addressed This Visit    None    Visit Diagnoses    Acute sinusitis, recurrence not specified, unspecified location    -  Primary   Discussed sinusitis care and treatment safe use of Augmentin nasal rinse Mucinex Flonase Tylenol   Relevant Medications   amoxicillin-clavulanate (AUGMENTIN) 875-125 MG tablet       Follow up plan: Return if symptoms worsen or fail to improve, for As scheduled.

## 2015-10-14 ENCOUNTER — Ambulatory Visit: Payer: BLUE CROSS/BLUE SHIELD | Admitting: Family Medicine

## 2015-12-27 ENCOUNTER — Encounter (INDEPENDENT_AMBULATORY_CARE_PROVIDER_SITE_OTHER): Payer: Self-pay

## 2016-01-07 ENCOUNTER — Other Ambulatory Visit: Payer: Self-pay

## 2016-01-10 ENCOUNTER — Ambulatory Visit (INDEPENDENT_AMBULATORY_CARE_PROVIDER_SITE_OTHER): Payer: BLUE CROSS/BLUE SHIELD | Admitting: Family Medicine

## 2016-01-10 ENCOUNTER — Encounter: Payer: Self-pay | Admitting: Family Medicine

## 2016-01-10 VITALS — BP 149/81 | HR 76 | Temp 97.5°F | Ht 69.25 in | Wt 172.0 lb

## 2016-01-10 DIAGNOSIS — N401 Enlarged prostate with lower urinary tract symptoms: Secondary | ICD-10-CM | POA: Diagnosis not present

## 2016-01-10 DIAGNOSIS — E7800 Pure hypercholesterolemia, unspecified: Secondary | ICD-10-CM | POA: Diagnosis not present

## 2016-01-10 DIAGNOSIS — Z0001 Encounter for general adult medical examination with abnormal findings: Secondary | ICD-10-CM

## 2016-01-10 DIAGNOSIS — Z125 Encounter for screening for malignant neoplasm of prostate: Secondary | ICD-10-CM | POA: Diagnosis not present

## 2016-01-10 DIAGNOSIS — E782 Mixed hyperlipidemia: Secondary | ICD-10-CM

## 2016-01-10 DIAGNOSIS — N138 Other obstructive and reflux uropathy: Secondary | ICD-10-CM

## 2016-01-10 DIAGNOSIS — R748 Abnormal levels of other serum enzymes: Secondary | ICD-10-CM | POA: Diagnosis not present

## 2016-01-10 DIAGNOSIS — Z0000 Encounter for general adult medical examination without abnormal findings: Secondary | ICD-10-CM | POA: Diagnosis not present

## 2016-01-10 LAB — URINALYSIS, ROUTINE W REFLEX MICROSCOPIC
Bilirubin, UA: NEGATIVE
GLUCOSE, UA: NEGATIVE
Ketones, UA: NEGATIVE
LEUKOCYTES UA: NEGATIVE
NITRITE UA: NEGATIVE
Protein, UA: NEGATIVE
RBC, UA: NEGATIVE
Specific Gravity, UA: 1.01 (ref 1.005–1.030)
UUROB: 0.2 mg/dL (ref 0.2–1.0)
pH, UA: 6 (ref 5.0–7.5)

## 2016-01-10 MED ORDER — TAMSULOSIN HCL 0.4 MG PO CAPS: 0.4000 mg | ORAL_CAPSULE | Freq: Every day | ORAL | 4 refills | Status: DC

## 2016-01-10 MED ORDER — ATORVASTATIN CALCIUM 10 MG PO TABS: 10.0000 mg | ORAL_TABLET | Freq: Every day | ORAL | 4 refills | Status: DC

## 2016-01-10 NOTE — Assessment & Plan Note (Signed)
The current medical regimen is effective;  continue present plan and medications.  

## 2016-01-10 NOTE — Assessment & Plan Note (Addendum)
The current medical regimen is effective;  continue present plan and medications. Discussed cholesterol may be involved in patient's symptoms but doubtful we'll do a cold for 2 weeks observe symptoms restart observe symptoms and notify us if related.

## 2016-01-10 NOTE — Progress Notes (Signed)
BP (!) 149/81   Pulse 76   Temp 97.5 F (36.4 C)   Ht 5' 9.25" (1.759 m)   Wt 172 lb (78 kg)   SpO2 97%   BMI 25.22 kg/m    Subjective:    Patient ID: Dennis Mclaughlin, male    DOB: 05/12/1950, 65 y.o.   MRN: [id number]  HPI: Dennis ByarsRonnie D Mclaughlin is a 65 y.o. male  Chief Complaint  Patient presents with  . Annual Exam  Patient with great deal of fatigue has been working really hard at work second shift gets adequate sleep and nutrition but a great deal of stress at work with 12 10/05/2008 hour days in a row then only 2 days off.  No issues with Lipitor though has an concerned may be causing some of his symptoms. Taking tamsulosin for BPH with no issues  Relevant past medical, surgical, family and social history reviewed and updated as indicated. Interim medical history since our last visit reviewed. Allergies and medications reviewed and updated.  Review of Systems  Constitutional: Negative.   HENT: Negative.   Eyes: Negative.   Respiratory: Negative.   Cardiovascular: Negative.   Gastrointestinal: Negative.   Endocrine: Negative.   Genitourinary: Negative.   Musculoskeletal: Negative.   Skin: Negative.   Allergic/Immunologic: Negative.   Neurological: Negative.   Hematological: Negative.   Psychiatric/Behavioral: Negative.     Per HPI unless specifically indicated above     Objective:    BP (!) 149/81   Pulse 76   Temp 97.5 F (36.4 C)   Ht 5' 9.25" (1.759 m)   Wt 172 lb (78 kg)   SpO2 97%   BMI 25.22 kg/m   Wt Readings from Last 3 Encounters:  01/10/16 172 lb (78 kg)  10/12/15 168 lb (76.2 kg)  07/12/15 172 lb (78 kg)    Physical Exam  Constitutional: He is oriented to person, place, and time. He appears well-developed and well-nourished.  HENT:  Head: Normocephalic and atraumatic.  Right Ear: External ear normal.  Left Ear: External ear normal.  Eyes: Conjunctivae and EOM are normal. Pupils are equal, round, and reactive to light.  Neck: Normal range  of motion. Neck supple.  Cardiovascular: Normal rate, regular rhythm, normal heart sounds and intact distal pulses.   Pulmonary/Chest: Effort normal and breath sounds normal.  Abdominal: Soft. Bowel sounds are normal. There is no splenomegaly or hepatomegaly.  Genitourinary: Rectum normal and penis normal.  Genitourinary Comments: Prostate enlarged  Musculoskeletal: Normal range of motion.  Neurological: He is alert and oriented to person, place, and time. He has normal reflexes.  Skin: No rash noted. No erythema.  Psychiatric: He has a normal mood and affect. His behavior is normal. Judgment and thought content normal.    Results for orders placed or performed in visit on 09/13/15  ALT  Result Value Ref Range   ALT 24 0 - 44 IU/L  AST  Result Value Ref Range   AST 20 0 - 40 IU/L      Assessment & Plan:   Problem List Items Addressed This Visit      Genitourinary   BPH with obstruction/lower urinary tract symptoms    The current medical regimen is effective;  continue present plan and medications.       Relevant Medications   tamsulosin (FLOMAX) 0.4 MG CAPS capsule   Other Relevant Orders   PSA   Urinalysis, Routine w reflex microscopic (not [facility])     Other  Hyperlipidemia    The current medical regimen is effective;  continue present plan and medications. Discussed cholesterol may be involved in patient's symptoms but doubtful we'll do a cold for 2 weeks observe symptoms restart observe symptoms and notify us if related.       Relevant Medications   atorvastatin (LIPITOR) 10 MG tablet   Other Relevant Orders   Lipid Profile    Other Visit Diagnoses    PE (physical exam), annual    -  Primary   Relevant Orders   CBC with Differential/Platelet   Comprehensive metabolic panel   Lipid Profile   PSA   TSH   Urinalysis, Routine w reflex microscopic (not [facility])   Elevated liver enzymes       Relevant Orders   Comprehensive metabolic panel   Screening PSA  (prostate specific antigen)       Relevant Orders   PSA       Follow up plan: Return for   Lipids, ALT, AST.

## 2016-01-11 ENCOUNTER — Telehealth: Payer: Self-pay | Admitting: Family Medicine

## 2016-01-11 DIAGNOSIS — R7989 Other specified abnormal findings of blood chemistry: Secondary | ICD-10-CM

## 2016-01-11 LAB — LIPID PANEL
Chol/HDL Ratio: 3.6 ratio units (ref 0.0–5.0)
Cholesterol, Total: 184 mg/dL (ref 100–199)
HDL: 51 mg/dL (ref >39)
LDL CALC: 111 mg/dL — AB (ref 0–99)
Triglycerides: 112 mg/dL (ref 0–149)
VLDL Cholesterol Cal: 22 mg/dL (ref 5–40)

## 2016-01-11 LAB — COMPREHENSIVE METABOLIC PANEL
A/G RATIO: 2 (ref 1.2–2.2)
ALBUMIN: 4.5 g/dL (ref 3.6–4.8)
ALK PHOS: 83 IU/L (ref 39–117)
ALT: 37 IU/L (ref 0–44)
AST: 25 IU/L (ref 0–40)
BILIRUBIN TOTAL: 0.5 mg/dL (ref 0.0–1.2)
BUN / CREAT RATIO: 11 (ref 10–24)
BUN: 11 mg/dL (ref 8–27)
CHLORIDE: 98 mmol/L (ref 96–106)
CO2: 25 mmol/L (ref 18–29)
Calcium: 9.8 mg/dL (ref 8.6–10.2)
Creatinine, Ser: 1.03 mg/dL (ref 0.76–1.27)
GFR calc non Af Amer: 77 mL/min/{1.73_m2} (ref >59)
GFR, EST AFRICAN AMERICAN: 89 mL/min/{1.73_m2} (ref >59)
GLOBULIN, TOTAL: 2.3 g/dL (ref 1.5–4.5)
Glucose: 101 mg/dL — ABNORMAL HIGH (ref 65–99)
Potassium: 4.7 mmol/L (ref 3.5–5.2)
SODIUM: 140 mmol/L (ref 134–144)
TOTAL PROTEIN: 6.8 g/dL (ref 6.0–8.5)

## 2016-01-11 LAB — CBC WITH DIFFERENTIAL/PLATELET
BASOS: 0 %
Basophils Absolute: 0 10*3/uL (ref 0.0–0.2)
EOS (ABSOLUTE): 0.1 10*3/uL (ref 0.0–0.4)
EOS: 2 %
HEMATOCRIT: 41.2 % (ref 37.5–51.0)
Hemoglobin: 13.8 g/dL (ref 12.6–17.7)
IMMATURE GRANULOCYTES: 0 %
Immature Grans (Abs): 0 10*3/uL (ref 0.0–0.1)
LYMPHS ABS: 1.9 10*3/uL (ref 0.7–3.1)
Lymphs: 27 %
MCH: 30.9 pg (ref 26.6–33.0)
MCHC: 33.5 g/dL (ref 31.5–35.7)
MCV: 92 fL (ref 79–97)
MONOS ABS: 0.7 10*3/uL (ref 0.1–0.9)
Monocytes: 10 %
NEUTROS PCT: 61 %
Neutrophils Absolute: 4.4 10*3/uL (ref 1.4–7.0)
PLATELETS: 222 10*3/uL (ref 150–379)
RBC: 4.47 x10E6/uL (ref 4.14–5.80)
RDW: 13.4 % (ref 12.3–15.4)
WBC: 7.2 10*3/uL (ref 3.4–10.8)

## 2016-01-11 LAB — TSH: TSH: 5.75 u[IU]/mL — AB (ref 0.450–4.500)

## 2016-01-11 LAB — PSA: PROSTATE SPECIFIC AG, SERUM: 1.4 ng/mL (ref 0.0–4.0)

## 2016-01-11 NOTE — Telephone Encounter (Signed)
Phone call Discussed with patient slight elevation TSH will recheck TSH 2 months.

## 2016-02-22 ENCOUNTER — Other Ambulatory Visit: Payer: Self-pay | Admitting: Family Medicine

## 2016-02-22 DIAGNOSIS — E782 Mixed hyperlipidemia: Secondary | ICD-10-CM

## 2016-02-22 DIAGNOSIS — E7800 Pure hypercholesterolemia, unspecified: Secondary | ICD-10-CM

## 2016-03-01 ENCOUNTER — Telehealth: Payer: Self-pay | Admitting: Family Medicine

## 2016-03-01 NOTE — Telephone Encounter (Signed)
Year supply of medication sent to [facility] in November.

## 2016-03-01 NOTE — Telephone Encounter (Signed)
Patient called to make sure his prescriptions were sent to [facility]. Per patient at his last visit with Dr Dossie Arbourrissman he sent the scripts to [facility] instead of [facility].   Thank You Clydie BraunKaren

## 2016-03-13 ENCOUNTER — Other Ambulatory Visit: Payer: Self-pay | Admitting: Family Medicine

## 2016-03-13 DIAGNOSIS — E7800 Pure hypercholesterolemia, unspecified: Secondary | ICD-10-CM

## 2016-03-13 DIAGNOSIS — E782 Mixed hyperlipidemia: Secondary | ICD-10-CM

## 2016-03-13 NOTE — Telephone Encounter (Signed)
Pt was given a years supply in November. Was sent to [facility].

## 2016-03-13 NOTE — Telephone Encounter (Signed)
Left voicemail for patient

## 2016-03-13 NOTE — Telephone Encounter (Signed)
RX phoned in to [facility].

## 2016-03-13 NOTE — Telephone Encounter (Signed)
Patient needs his Atorvastin 10-mg.Marland Kitchen. He is having some problems with this stating the pharmacy has been faxing over request but per pharmacy it appears that Dr Dossie Arbourrissman has d/c this medication.  He is needing this resolved ASAP.   Thank Bonita QuinYou  Clydie BraunKaren  [phone]

## 2016-03-13 NOTE — Telephone Encounter (Signed)
rx

## 2016-03-13 NOTE — Telephone Encounter (Signed)
Per a&P from 01/10/16, "The current medical regimen is effective;  continue present plan and medications."  Please advise. Do not note anything stating you d/c medication.

## 2016-06-08 ENCOUNTER — Encounter: Payer: Self-pay | Admitting: Family Medicine

## 2016-06-08 ENCOUNTER — Ambulatory Visit (INDEPENDENT_AMBULATORY_CARE_PROVIDER_SITE_OTHER): Payer: BLUE CROSS/BLUE SHIELD | Admitting: Family Medicine

## 2016-06-08 VITALS — BP 128/83 | HR 94 | Temp 98.0°F | Wt 175.6 lb

## 2016-06-08 DIAGNOSIS — R21 Rash and other nonspecific skin eruption: Secondary | ICD-10-CM | POA: Diagnosis not present

## 2016-06-08 MED ORDER — DOXYCYCLINE HYCLATE 100 MG PO TABS: 100.0000 mg | ORAL_TABLET | Freq: Two times a day (BID) | ORAL | 0 refills | Status: DC

## 2016-06-08 NOTE — Patient Instructions (Signed)
Follow up as needed

## 2016-06-08 NOTE — Progress Notes (Signed)
   BP 128/83   Pulse 94   Temp 98 F (36.7 C)   Wt 175 lb 9.6 oz (79.7 kg)   SpO2 99%   BMI 25.74 kg/m    Subjective:    Patient ID: Dennis Mclaughlin, male    DOB: [birth year], 66 y.o.   MRN: [id number]  HPI: Dennis Mclaughlin is a 66 y.o. male  Chief Complaint  Patient presents with  . Tick Removal   Tick bite x 2 weeks ago on right hip, noticing a bulls eye rash intermittently. Some itching but nothing major. Denies fever, chills, body aches, joint pain, HAs, fatigue. Using antibiotic cream and alcohol on rash. No hx of tick-bourne illness.   Relevant past medical, surgical, family and social history reviewed and updated as indicated. Interim medical history since our last visit reviewed. Allergies and medications reviewed and updated.  Review of Systems  Constitutional: Negative.   HENT: Negative.   Respiratory: Negative.   Cardiovascular: Negative.   Musculoskeletal: Negative.   Skin: Positive for rash.  Neurological: Negative.   Psychiatric/Behavioral: Negative.     Per HPI unless specifically indicated above     Objective:    BP 128/83   Pulse 94   Temp 98 F (36.7 C)   Wt 175 lb 9.6 oz (79.7 kg)   SpO2 99%   BMI 25.74 kg/m   Wt Readings from Last 3 Encounters:  06/08/16 175 lb 9.6 oz (79.7 kg)  01/10/16 172 lb (78 kg)  10/12/15 168 lb (76.2 kg)    Physical Exam  Constitutional: He is oriented to person, place, and time. He appears well-developed and well-nourished. No distress.  HENT:  Head: Atraumatic.  Eyes: Conjunctivae are normal. Pupils are equal, round, and reactive to light.  Neck: Normal range of motion. Neck supple.  Cardiovascular: Normal rate and normal heart sounds.   Pulmonary/Chest: Effort normal and breath sounds normal. No respiratory distress.  Musculoskeletal: Normal range of motion.  Neurological: He is alert and oriented to person, place, and time.  Skin: Skin is warm and dry. Rash (widespread annular, erythematous rash surrounding  bite site on right hip) noted.  Psychiatric: He has a normal mood and affect. His behavior is normal.  Nursing note and vitals reviewed.     Assessment & Plan:   Problem List Items Addressed This Visit    None    Visit Diagnoses    Rash    -  Primary   Relevant Orders   Rocky mtn spotted fvr abs pnl(IgG+IgM)   Lyme Ab/Western Blot Reflex    Unclear if the area of redness is bullseye rash or cellulitis since he had to dig the head of the tick out. Will await RMSF and lyme testing, start 1 week of doxycycline in meantime. Continue dressing with neosporin and using ice to the area. F/u if worsening or no improvement   Follow up plan: Return if symptoms worsen or fail to improve.

## 2016-06-12 ENCOUNTER — Telehealth: Payer: Self-pay | Admitting: Family Medicine

## 2016-06-12 LAB — RMSF, IGG, IFA: RMSF, IGG, IFA: <1:64 {titer}

## 2016-06-12 LAB — ROCKY MTN SPOTTED FVR ABS PNL(IGG+IGM)
RMSF IgG: POSITIVE — AB
RMSF IgM: 0.4 index (ref 0.00–0.89)

## 2016-06-12 LAB — LYME AB/WESTERN BLOT REFLEX
LYME DISEASE AB, QUANT, IGM: <0.8 index (ref 0.00–0.79)
Lyme IgG/IgM Ab: <0.91 {ISR} (ref 0.00–0.90)

## 2016-06-12 MED ORDER — DOXYCYCLINE HYCLATE 100 MG PO TABS: 100.0000 mg | ORAL_TABLET | Freq: Two times a day (BID) | ORAL | 0 refills | Status: DC

## 2016-06-12 NOTE — Telephone Encounter (Signed)
Patient notified

## 2016-06-12 NOTE — Telephone Encounter (Signed)
Please call pt and let him know he tested positive for RMSF, lyme was negative - will extend his doxycycline one more week to be on the safe side even though 7 days is typically enough. Sending that to his pharmacy.

## 2016-06-23 ENCOUNTER — Ambulatory Visit (INDEPENDENT_AMBULATORY_CARE_PROVIDER_SITE_OTHER): Payer: BLUE CROSS/BLUE SHIELD | Admitting: Family Medicine

## 2016-06-23 ENCOUNTER — Encounter: Payer: Self-pay | Admitting: Family Medicine

## 2016-06-23 VITALS — BP 126/86 | HR 71 | Temp 98.0°F | Wt 174.0 lb

## 2016-06-23 DIAGNOSIS — A770 Spotted fever due to Rickettsia rickettsii: Secondary | ICD-10-CM | POA: Diagnosis not present

## 2016-06-23 MED ORDER — DOXYCYCLINE HYCLATE 100 MG PO TABS: 100.0000 mg | ORAL_TABLET | Freq: Two times a day (BID) | ORAL | 0 refills | Status: DC

## 2016-06-23 NOTE — Progress Notes (Signed)
BP 126/86   Pulse 71   Temp 98 F (36.7 C)   Wt 174 lb (78.9 kg)   SpO2 97%   BMI 25.51 kg/m    Subjective:    Patient ID: Dennis Mclaughlin, male    DOB: 24-Jun-1950, 66 y.o.   MRN: [id number]  HPI: Dennis Mclaughlin is a 66 y.o. male  Chief Complaint  Patient presents with  . Tick Bite    over the weekend got more tick bites. Noticed his glands were swollen, some headache and some nausea, skin feels sunburned across his neck and shoulders. He thinks he might have Lyme's Disease.    Patient presents with continued sxs following acute RMSF infection 2 weeks ago. Has now developed nausea, HAs, some adenopathy, and a burning sensation on upper back as if he had a sunburn. Several new tick bites over weekend. Has completed 2 weeks worth of doxycycline but very concerned that he has Lyme dz. Initial testing was negative. Denies fever, aches, fatigue, new rashes.  Relevant past medical, surgical, family and social history reviewed and updated as indicated. Interim medical history since our last visit reviewed. Allergies and medications reviewed and updated.  Review of Systems  Constitutional: Negative.   HENT: Negative.   Eyes: Negative.   Respiratory: Negative.   Gastrointestinal: Positive for nausea.  Genitourinary: Negative.   Musculoskeletal: Negative.   Skin:       Burning sensation on upper back  Neurological: Positive for headaches.  Psychiatric/Behavioral: Negative.     Per HPI unless specifically indicated above     Objective:    BP 126/86   Pulse 71   Temp 98 F (36.7 C)   Wt 174 lb (78.9 kg)   SpO2 97%   BMI 25.51 kg/m   Wt Readings from Last 3 Encounters:  06/23/16 174 lb (78.9 kg)  06/08/16 175 lb 9.6 oz (79.7 kg)  01/10/16 172 lb (78 kg)    Physical Exam  Constitutional: He is oriented to person, place, and time. He appears well-developed and well-nourished. No distress.  HENT:  Head: Atraumatic.  Eyes: Conjunctivae are normal. Pupils are equal, round,  and reactive to light.  Neck: Normal range of motion. Neck supple.  Cardiovascular: Normal rate and normal heart sounds.   Pulmonary/Chest: Effort normal and breath sounds normal. No respiratory distress.  Musculoskeletal: Normal range of motion.  Neurological: He is alert and oriented to person, place, and time. No cranial nerve deficit.  Skin: Skin is warm and dry. No rash noted.  Psychiatric: He has a normal mood and affect. His behavior is normal.  Nursing note and vitals reviewed.   Results for orders placed or performed in visit on 06/08/16  Rocky mtn spotted fvr abs pnl(IgG+IgM)  Result Value Ref Range   RMSF IgG Positive (A) Negative   RMSF IgM 0.40 0.00 - 0.89 index  Lyme Ab/Western Blot Reflex  Result Value Ref Range   Lyme IgG/IgM Ab <0.91 0.00 - 0.90 ISR   LYME DISEASE AB, QUANT, IGM <0.80 0.00 - 0.79 index  RMSF, IgG, IFA  Result Value Ref Range   RMSF, IGG, IFA <1:64 Neg <1:64      Assessment & Plan:   Problem List Items Addressed This Visit    None    Visit Diagnoses    RMSF [facility] spotted fever)    -  Primary   Given continued sxs, will add 1 week of doxy for total of 21 day course. If no  improvement over next few weeks, will retest. Discussed tick bite prevention       Follow up plan: Return if symptoms worsen or fail to improve.

## 2016-06-26 NOTE — Patient Instructions (Signed)
Follow up if no improvement 

## 2016-10-03 ENCOUNTER — Ambulatory Visit (INDEPENDENT_AMBULATORY_CARE_PROVIDER_SITE_OTHER): Payer: BLUE CROSS/BLUE SHIELD | Admitting: Family Medicine

## 2016-10-03 ENCOUNTER — Encounter: Payer: Self-pay | Admitting: Family Medicine

## 2016-10-03 VITALS — BP 119/77 | HR 83 | Temp 98.2°F | Wt 170.0 lb

## 2016-10-03 DIAGNOSIS — J069 Acute upper respiratory infection, unspecified: Secondary | ICD-10-CM | POA: Diagnosis not present

## 2016-10-03 MED ORDER — AZITHROMYCIN 250 MG PO TABS: ORAL_TABLET | ORAL | 0 refills | Status: DC

## 2016-10-03 NOTE — Progress Notes (Signed)
   BP 119/77   Pulse 83   Temp 98.2 F (36.8 C)   Wt 170 lb (77.1 kg)   SpO2 99%   BMI 24.92 kg/m    Subjective:    Patient ID: Dennis Mclaughlin, male    DOB: 07/14/1950, 66 y.o.   MRN: [id number]  HPI: Dennis Mclaughlin is a 66 y.o. male  Chief Complaint  Patient presents with  . URI    has had off and on colds over the summer, worse over the last 2 days after mowing. Head/chest congestion. sinus drainage, sneezing, runny nose, some cough. Does take an antihistamine. No fever, no sore throat.   3 weeks of persistent congestion, cough, chest tightness, sinus pressure. Denies fever, chills, aches, sore throat, ear pain. Trying coricidin and allergy tabs with no relief. No sick contacts noted.   Relevant past medical, surgical, family and social history reviewed and updated as indicated. Interim medical history since our last visit reviewed. Allergies and medications reviewed and updated.  Review of Systems  Constitutional: Negative.   HENT: Positive for congestion, postnasal drip, sinus pressure and sneezing.   Eyes: Negative.   Respiratory: Positive for cough and chest tightness.   Cardiovascular: Negative.   Gastrointestinal: Negative.   Genitourinary: Negative.   Musculoskeletal: Negative.   Neurological: Negative.   Psychiatric/Behavioral: Negative.    Per HPI unless specifically indicated above     Objective:    BP 119/77   Pulse 83   Temp 98.2 F (36.8 C)   Wt 170 lb (77.1 kg)   SpO2 99%   BMI 24.92 kg/m   Wt Readings from Last 3 Encounters:  10/03/16 170 lb (77.1 kg)  06/23/16 174 lb (78.9 kg)  06/08/16 175 lb 9.6 oz (79.7 kg)    Physical Exam  Constitutional: He is oriented to person, place, and time. He appears well-developed and well-nourished. No distress.  HENT:  Head: Atraumatic.  Eyes: Pupils are equal, round, and reactive to light. Conjunctivae are normal. No scleral icterus.  Neck: Normal range of motion. Neck supple.  Cardiovascular: Normal rate  and normal heart sounds.   Pulmonary/Chest: Effort normal. No respiratory distress. He has wheezes (minimal diffuse).  Musculoskeletal: Normal range of motion.  Neurological: He is alert and oriented to person, place, and time.  Skin: Skin is warm and dry.  Psychiatric: He has a normal mood and affect. His behavior is normal.  Nursing note and vitals reviewed.  Results for orders placed or performed in visit on 06/08/16  Rocky mtn spotted fvr abs pnl(IgG+IgM)  Result Value Ref Range   RMSF IgG Positive (A) Negative   RMSF IgM 0.40 0.00 - 0.89 index  Lyme Ab/Western Blot Reflex  Result Value Ref Range   Lyme IgG/IgM Ab <0.91 0.00 - 0.90 ISR   LYME DISEASE AB, QUANT, IGM <0.80 0.00 - 0.79 index  RMSF, IgG, IFA  Result Value Ref Range   RMSF, IGG, IFA <1:64 Neg <1:64      Assessment & Plan:   Problem List Items Addressed This Visit    None    Visit Diagnoses    Upper respiratory tract infection, unspecified type    -  Primary   Azithromycin sent, continue OTC remedies, humidifier, rest, push fluids. F/u if worsening or no improvement   Relevant Medications   azithromycin (ZITHROMAX) 250 MG tablet       Follow up plan: Return if symptoms worsen or fail to improve.

## 2016-10-04 NOTE — Patient Instructions (Signed)
Follow up if no improvement 

## 2017-01-10 ENCOUNTER — Ambulatory Visit (INDEPENDENT_AMBULATORY_CARE_PROVIDER_SITE_OTHER): Payer: BLUE CROSS/BLUE SHIELD | Admitting: Family Medicine

## 2017-01-10 ENCOUNTER — Encounter: Payer: Self-pay | Admitting: Family Medicine

## 2017-01-10 VITALS — BP 143/80 | HR 86 | Ht 70.0 in | Wt 178.0 lb

## 2017-01-10 DIAGNOSIS — Z0000 Encounter for general adult medical examination without abnormal findings: Secondary | ICD-10-CM | POA: Diagnosis not present

## 2017-01-10 DIAGNOSIS — N138 Other obstructive and reflux uropathy: Secondary | ICD-10-CM | POA: Diagnosis not present

## 2017-01-10 DIAGNOSIS — E7800 Pure hypercholesterolemia, unspecified: Secondary | ICD-10-CM

## 2017-01-10 DIAGNOSIS — E782 Mixed hyperlipidemia: Secondary | ICD-10-CM | POA: Diagnosis not present

## 2017-01-10 DIAGNOSIS — N401 Enlarged prostate with lower urinary tract symptoms: Secondary | ICD-10-CM | POA: Diagnosis not present

## 2017-01-10 DIAGNOSIS — R7989 Other specified abnormal findings of blood chemistry: Secondary | ICD-10-CM

## 2017-01-10 DIAGNOSIS — Z23 Encounter for immunization: Secondary | ICD-10-CM

## 2017-01-10 LAB — URINALYSIS, ROUTINE W REFLEX MICROSCOPIC
BILIRUBIN UA: NEGATIVE
Glucose, UA: NEGATIVE
LEUKOCYTES UA: NEGATIVE
Nitrite, UA: NEGATIVE
SPEC GRAV UA: 1.02 (ref 1.005–1.030)
Urobilinogen, Ur: 0.2 mg/dL (ref 0.2–1.0)
pH, UA: 5.5 (ref 5.0–7.5)

## 2017-01-10 LAB — MICROSCOPIC EXAMINATION: BACTERIA UA: NONE SEEN

## 2017-01-10 MED ORDER — TAMSULOSIN HCL 0.4 MG PO CAPS: 0.4000 mg | ORAL_CAPSULE | Freq: Every day | ORAL | 4 refills | Status: DC

## 2017-01-10 MED ORDER — ATORVASTATIN CALCIUM 10 MG PO TABS: 10.0000 mg | ORAL_TABLET | Freq: Every day | ORAL | 4 refills | Status: DC

## 2017-01-10 NOTE — Progress Notes (Signed)
BP (!) 143/80   Pulse 86   Ht 5\' 10"  (1.778 m)   Wt 178 lb (80.7 kg)   SpO2 96%   BMI 25.54 kg/m    Subjective:    Patient ID: Dennis Mclaughlin, male    DOB: 12/31/1950, 66 y.o.   MRN: [id number]  HPI: Dennis Mclaughlin is a 66 y.o. male  Chief Complaint  Patient presents with  . Annual Exam   Patient all in all doing well has history of low testosterone but has not elected for replacement therapy because of expense. So cholesterol also doing well no complaints from atorvastatin. Flomax doing well for BPH symptoms.  Relevant past medical, surgical, family and social history reviewed and updated as indicated. Interim medical history since our last visit reviewed. Allergies and medications reviewed and updated.  Review of Systems  Constitutional: Negative.   HENT: Negative.   Eyes: Negative.   Respiratory: Negative.   Cardiovascular: Negative.   Gastrointestinal: Negative.   Endocrine: Negative.   Genitourinary: Negative.   Musculoskeletal: Negative.   Skin: Negative.   Allergic/Immunologic: Negative.   Neurological: Negative.   Hematological: Negative.   Psychiatric/Behavioral: Negative.     Per HPI unless specifically indicated above     Objective:    BP (!) 143/80   Pulse 86   Ht 5\' 10"  (1.778 m)   Wt 178 lb (80.7 kg)   SpO2 96%   BMI 25.54 kg/m   Wt Readings from Last 3 Encounters:  01/10/17 178 lb (80.7 kg)  10/03/16 170 lb (77.1 kg)  06/23/16 174 lb (78.9 kg)    Physical Exam  Constitutional: He is oriented to person, place, and time. He appears well-developed and well-nourished.  HENT:  Head: Normocephalic and atraumatic.  Right Ear: External ear normal.  Left Ear: External ear normal.  Eyes: Conjunctivae and EOM are normal. Pupils are equal, round, and reactive to light.  Neck: Normal range of motion. Neck supple.  Cardiovascular: Normal rate, regular rhythm, normal heart sounds and intact distal pulses.  Pulmonary/Chest: Effort normal and breath  sounds normal.  Abdominal: Soft. Bowel sounds are normal. There is no splenomegaly or hepatomegaly.  Genitourinary: Rectum normal and penis normal.  Genitourinary Comments: BPH changes  Musculoskeletal: Normal range of motion.  Neurological: He is alert and oriented to person, place, and time. He has normal reflexes.  Skin: No rash noted. No erythema.  Psychiatric: He has a normal mood and affect. His behavior is normal. Judgment and thought content normal.    Results for orders placed or performed in visit on 06/08/16  Rocky mtn spotted fvr abs pnl(IgG+IgM)  Result Value Ref Range   RMSF IgG Positive (A) Negative   RMSF IgM 0.40 0.00 - 0.89 index  Lyme Ab/Western Blot Reflex  Result Value Ref Range   Lyme IgG/IgM Ab <0.91 0.00 - 0.90 ISR   LYME DISEASE AB, QUANT, IGM <0.80 0.00 - 0.79 index  RMSF, IgG, IFA  Result Value Ref Range   RMSF, IGG, IFA <1:64 Neg <1:64      Assessment & Plan:   Problem List Items Addressed This Visit      Genitourinary   BPH with obstruction/lower urinary tract symptoms    The current medical regimen is effective;  continue present plan and medications.         Other   Hyperlipidemia - Primary    The current medical regimen is effective;  continue present plan and medications.  Low testosterone    No treatment for       Other Visit Diagnoses    Need for Tdap vaccination       Relevant Orders   Tdap vaccine greater than or equal to 7yo IM (Completed)       Follow up plan: No Follow-up on file.

## 2017-01-10 NOTE — Assessment & Plan Note (Addendum)
No treatment

## 2017-01-10 NOTE — Assessment & Plan Note (Signed)
The current medical regimen is effective;  continue present plan and medications.  

## 2017-01-11 LAB — COMPREHENSIVE METABOLIC PANEL
ALT: 38 IU/L (ref 0–44)
AST: 29 IU/L (ref 0–40)
Albumin/Globulin Ratio: 2.1 (ref 1.2–2.2)
Albumin: 4.6 g/dL (ref 3.6–4.8)
Alkaline Phosphatase: 83 IU/L (ref 39–117)
BUN/Creatinine Ratio: 11 (ref 10–24)
BUN: 12 mg/dL (ref 8–27)
Bilirubin Total: 0.5 mg/dL (ref 0.0–1.2)
CALCIUM: 9.7 mg/dL (ref 8.6–10.2)
CO2: 24 mmol/L (ref 20–29)
CREATININE: 1.05 mg/dL (ref 0.76–1.27)
Chloride: 104 mmol/L (ref 96–106)
GFR calc Af Amer: 86 mL/min/{1.73_m2} (ref >59)
GFR, EST NON AFRICAN AMERICAN: 75 mL/min/{1.73_m2} (ref >59)
Globulin, Total: 2.2 g/dL (ref 1.5–4.5)
Glucose: 99 mg/dL (ref 65–99)
Potassium: 5.1 mmol/L (ref 3.5–5.2)
Sodium: 142 mmol/L (ref 134–144)
Total Protein: 6.8 g/dL (ref 6.0–8.5)

## 2017-01-11 LAB — CBC WITH DIFFERENTIAL/PLATELET
Basophils Absolute: 0 10*3/uL (ref 0.0–0.2)
Basos: 0 %
EOS (ABSOLUTE): 0 10*3/uL (ref 0.0–0.4)
EOS: 1 %
HEMATOCRIT: 44.3 % (ref 37.5–51.0)
Hemoglobin: 14.5 g/dL (ref 13.0–17.7)
IMMATURE GRANS (ABS): 0 10*3/uL (ref 0.0–0.1)
IMMATURE GRANULOCYTES: 0 %
LYMPHS: 26 %
Lymphocytes Absolute: 1.7 10*3/uL (ref 0.7–3.1)
MCH: 31 pg (ref 26.6–33.0)
MCHC: 32.7 g/dL (ref 31.5–35.7)
MCV: 95 fL (ref 79–97)
MONOS ABS: 0.5 10*3/uL (ref 0.1–0.9)
Monocytes: 7 %
NEUTROS PCT: 66 %
Neutrophils Absolute: 4.3 10*3/uL (ref 1.4–7.0)
PLATELETS: 216 10*3/uL (ref 150–379)
RBC: 4.68 x10E6/uL (ref 4.14–5.80)
RDW: 12.8 % (ref 12.3–15.4)
WBC: 6.6 10*3/uL (ref 3.4–10.8)

## 2017-01-11 LAB — LIPID PANEL
CHOL/HDL RATIO: 4.3 ratio (ref 0.0–5.0)
CHOLESTEROL TOTAL: 197 mg/dL (ref 100–199)
HDL: 46 mg/dL (ref >39)
LDL CALC: 121 mg/dL — AB (ref 0–99)
Triglycerides: 151 mg/dL — ABNORMAL HIGH (ref 0–149)
VLDL CHOLESTEROL CAL: 30 mg/dL (ref 5–40)

## 2017-01-11 LAB — TSH: TSH: 3.95 u[IU]/mL (ref 0.450–4.500)

## 2017-01-11 LAB — PSA: PROSTATE SPECIFIC AG, SERUM: 1.5 ng/mL (ref 0.0–4.0)

## 2017-01-12 ENCOUNTER — Encounter: Payer: Self-pay | Admitting: Family Medicine

## 2017-01-23 ENCOUNTER — Other Ambulatory Visit: Payer: Self-pay

## 2017-01-23 DIAGNOSIS — N138 Other obstructive and reflux uropathy: Secondary | ICD-10-CM

## 2017-01-23 DIAGNOSIS — E782 Mixed hyperlipidemia: Secondary | ICD-10-CM

## 2017-01-23 DIAGNOSIS — E7800 Pure hypercholesterolemia, unspecified: Secondary | ICD-10-CM

## 2017-01-23 DIAGNOSIS — N401 Enlarged prostate with lower urinary tract symptoms: Principal | ICD-10-CM

## 2017-01-23 MED ORDER — ATORVASTATIN CALCIUM 10 MG PO TABS: 10.0000 mg | ORAL_TABLET | Freq: Every day | ORAL | 4 refills | Status: DC

## 2017-01-23 MED ORDER — TAMSULOSIN HCL 0.4 MG PO CAPS: 0.4000 mg | ORAL_CAPSULE | Freq: Every day | ORAL | 4 refills | Status: DC

## 2017-01-23 NOTE — Telephone Encounter (Signed)
Pt needs RX sent to mail order.

## 2017-04-19 ENCOUNTER — Encounter: Payer: Self-pay | Admitting: Family Medicine

## 2017-07-12 ENCOUNTER — Ambulatory Visit: Payer: BLUE CROSS/BLUE SHIELD | Admitting: Family Medicine

## 2017-08-02 ENCOUNTER — Encounter: Payer: Self-pay | Admitting: Family Medicine

## 2017-08-02 ENCOUNTER — Ambulatory Visit (INDEPENDENT_AMBULATORY_CARE_PROVIDER_SITE_OTHER): Payer: BLUE CROSS/BLUE SHIELD | Admitting: Family Medicine

## 2017-08-02 DIAGNOSIS — G5793 Unspecified mononeuropathy of bilateral lower limbs: Secondary | ICD-10-CM

## 2017-08-02 DIAGNOSIS — N138 Other obstructive and reflux uropathy: Secondary | ICD-10-CM | POA: Diagnosis not present

## 2017-08-02 DIAGNOSIS — E782 Mixed hyperlipidemia: Secondary | ICD-10-CM | POA: Diagnosis not present

## 2017-08-02 DIAGNOSIS — N401 Enlarged prostate with lower urinary tract symptoms: Secondary | ICD-10-CM

## 2017-08-02 DIAGNOSIS — G5790 Unspecified mononeuropathy of unspecified lower limb: Secondary | ICD-10-CM | POA: Insufficient documentation

## 2017-08-02 NOTE — Assessment & Plan Note (Signed)
Discussed neuropathy care and treatment will avoid medications for now patient will use different shoes and padding.  This is shown to be effective in the past.

## 2017-08-02 NOTE — Progress Notes (Signed)
BP 132/75   Pulse 78   Wt 181 lb (82.1 kg)   SpO2 97%   BMI 25.97 kg/m    Subjective:    Patient ID: Dennis Mclaughlin, male    DOB: 05/15/1950, 67 y.o.   MRN: [id number]  HPI: Dennis Mclaughlin is a 67 y.o. male  Chief Complaint  Patient presents with  . Follow-up  . Cough  . Foot Pain  Follow-up all in all doing well taking atorvastatin for cholesterol without problems. BPH stable taking tamsulosin without issues needs to get new urologist discussed available urology people. Patient also with new problem of bilateral foot pain primarily comes on with working as standing and walking long distances every day.  Has tried different shoes and seem to help for a while.  Relevant past medical, surgical, family and social history reviewed and updated as indicated. Interim medical history since our last visit reviewed. Allergies and medications reviewed and updated.  Review of Systems  Constitutional: Negative.   Respiratory: Negative.   Cardiovascular: Negative.     Per HPI unless specifically indicated above     Objective:    BP 132/75   Pulse 78   Wt 181 lb (82.1 kg)   SpO2 97%   BMI 25.97 kg/m   Wt Readings from Last 3 Encounters:  08/02/17 181 lb (82.1 kg)  01/10/17 178 lb (80.7 kg)  10/03/16 170 lb (77.1 kg)    Physical Exam  Constitutional: He is oriented to person, place, and time. He appears well-developed and well-nourished.  HENT:  Head: Normocephalic and atraumatic.  Eyes: Conjunctivae and EOM are normal.  Neck: Normal range of motion.  Cardiovascular: Normal rate, regular rhythm and normal heart sounds.  Pulmonary/Chest: Effort normal and breath sounds normal.  Musculoskeletal: Normal range of motion.  Foot exam normal with good pulses and normal sensation.  Neurological: He is alert and oriented to person, place, and time.  Skin: No erythema.  Psychiatric: He has a normal mood and affect. His behavior is normal. Judgment and thought content normal.     Results for orders placed or performed in visit on 01/10/17  Microscopic Examination  Result Value Ref Range   WBC, UA 0-5 0 - 5 /hpf   RBC, UA 0-2 0 - 2 /hpf   Epithelial Cells (non renal) 0-10 0 - 10 /hpf   Mucus, UA Present Not Estab.   Bacteria, UA None seen None seen/Few  CBC with Differential/Platelet  Result Value Ref Range   WBC 6.6 3.4 - 10.8 x10E3/uL   RBC 4.68 4.14 - 5.80 x10E6/uL   Hemoglobin 14.5 13.0 - 17.7 g/dL   Hematocrit 40.944.3 81.137.5 - 51.0 %   MCV 95 79 - 97 fL   MCH 31.0 26.6 - 33.0 pg   MCHC 32.7 31.5 - 35.7 g/dL   RDW 91.412.8 78.212.3 - 95.615.4 %   Platelets 216 150 - 379 x10E3/uL   Neutrophils 66 Not Estab. %   Lymphs 26 Not Estab. %   Monocytes 7 Not Estab. %   Eos 1 Not Estab. %   Basos 0 Not Estab. %   Neutrophils Absolute 4.3 1.4 - 7.0 x10E3/uL   Lymphocytes Absolute 1.7 0.7 - 3.1 x10E3/uL   Monocytes Absolute 0.5 0.1 - 0.9 x10E3/uL   EOS (ABSOLUTE) 0.0 0.0 - 0.4 x10E3/uL   Basophils Absolute 0.0 0.0 - 0.2 x10E3/uL   Immature Granulocytes 0 Not Estab. %   Immature Grans (Abs) 0.0 0.0 - 0.1 x10E3/uL  Comprehensive  metabolic panel  Result Value Ref Range   Glucose 99 65 - 99 mg/dL   BUN 12 8 - 27 mg/dL   Creatinine, Ser 1.61 0.76 - 1.27 mg/dL   GFR calc non Af Amer 75 >59 mL/min/1.73   GFR calc Af Amer 86 >59 mL/min/1.73   BUN/Creatinine Ratio 11 10 - 24   Sodium 142 134 - 144 mmol/L   Potassium 5.1 3.5 - 5.2 mmol/L   Chloride 104 96 - 106 mmol/L   CO2 24 20 - 29 mmol/L   Calcium 9.7 8.6 - 10.2 mg/dL   Total Protein 6.8 6.0 - 8.5 g/dL   Albumin 4.6 3.6 - 4.8 g/dL   Globulin, Total 2.2 1.5 - 4.5 g/dL   Albumin/Globulin Ratio 2.1 1.2 - 2.2   Bilirubin Total 0.5 0.0 - 1.2 mg/dL   Alkaline Phosphatase 83 39 - 117 IU/L   AST 29 0 - 40 IU/L   ALT 38 0 - 44 IU/L  Lipid panel  Result Value Ref Range   Cholesterol, Total 197 100 - 199 mg/dL   Triglycerides 096 (H) 0 - 149 mg/dL   HDL 46 >04 mg/dL   VLDL Cholesterol Cal 30 5 - 40 mg/dL   LDL  Calculated 540 (H) 0 - 99 mg/dL   Chol/HDL Ratio 4.3 0.0 - 5.0 ratio  PSA  Result Value Ref Range   Prostate Specific Ag, Serum 1.5 0.0 - 4.0 ng/mL  TSH  Result Value Ref Range   TSH 3.950 0.450 - 4.500 uIU/mL  Urinalysis, Routine w reflex microscopic  Result Value Ref Range   Specific Gravity, UA 1.020 1.005 - 1.030   pH, UA 5.5 5.0 - 7.5   Color, UA Yellow Yellow   Appearance Ur Hazy (A) Clear   Leukocytes, UA Negative Negative   Protein, UA Trace (A) Negative/Trace   Glucose, UA Negative Negative   Ketones, UA Trace (A) Negative   RBC, UA Trace (A) Negative   Bilirubin, UA Negative Negative   Urobilinogen, Ur 0.2 0.2 - 1.0 mg/dL   Nitrite, UA Negative Negative   Microscopic Examination See below:       Assessment & Plan:   Problem List Items Addressed This Visit      Nervous and Auditory   Neuropathy of leg    Discussed neuropathy care and treatment will avoid medications for now patient will use different shoes and padding.  This is shown to be effective in the past.        Genitourinary   BPH with obstruction/lower urinary tract symptoms    The current medical regimen is effective;  continue present plan and medications.         Other   Hyperlipidemia    Labs pending but patient doing well with medications.       Discussed cough post bronchitic will give steroid inhaler Symbicort patient taught how to use  Follow up plan: Return in about 6 months (around 02/01/2018) for Physical Exam.

## 2017-08-02 NOTE — Assessment & Plan Note (Signed)
The current medical regimen is effective;  continue present plan and medications.  

## 2017-08-02 NOTE — Assessment & Plan Note (Signed)
Labs pending but patient doing well with medications.

## 2017-08-03 LAB — LIPID PANEL
Chol/HDL Ratio: 4.4 ratio (ref 0.0–5.0)
Cholesterol, Total: 211 mg/dL — ABNORMAL HIGH (ref 100–199)
HDL: 48 mg/dL (ref >39)
LDL Calculated: 140 mg/dL — ABNORMAL HIGH (ref 0–99)
Triglycerides: 117 mg/dL (ref 0–149)
VLDL Cholesterol Cal: 23 mg/dL (ref 5–40)

## 2017-08-03 LAB — ALT: ALT: 38 IU/L (ref 0–44)

## 2017-08-03 LAB — AST: AST: 29 IU/L (ref 0–40)

## 2017-08-06 ENCOUNTER — Encounter: Payer: Self-pay | Admitting: Family Medicine

## 2017-08-23 DIAGNOSIS — D225 Melanocytic nevi of trunk: Secondary | ICD-10-CM | POA: Diagnosis not present

## 2017-08-23 DIAGNOSIS — Z1283 Encounter for screening for malignant neoplasm of skin: Secondary | ICD-10-CM | POA: Diagnosis not present

## 2017-08-23 DIAGNOSIS — L720 Epidermal cyst: Secondary | ICD-10-CM | POA: Diagnosis not present

## 2017-08-23 DIAGNOSIS — L821 Other seborrheic keratosis: Secondary | ICD-10-CM | POA: Diagnosis not present

## 2018-01-11 ENCOUNTER — Ambulatory Visit (INDEPENDENT_AMBULATORY_CARE_PROVIDER_SITE_OTHER): Payer: BLUE CROSS/BLUE SHIELD

## 2018-01-11 DIAGNOSIS — Z23 Encounter for immunization: Secondary | ICD-10-CM

## 2018-02-06 DIAGNOSIS — M722 Plantar fascial fibromatosis: Secondary | ICD-10-CM | POA: Diagnosis not present

## 2018-02-07 ENCOUNTER — Other Ambulatory Visit: Payer: Self-pay | Admitting: Family Medicine

## 2018-02-07 ENCOUNTER — Ambulatory Visit (INDEPENDENT_AMBULATORY_CARE_PROVIDER_SITE_OTHER): Payer: BLUE CROSS/BLUE SHIELD | Admitting: Family Medicine

## 2018-02-07 ENCOUNTER — Encounter: Payer: Self-pay | Admitting: Family Medicine

## 2018-02-07 VITALS — BP 136/80 | HR 80 | Temp 97.8°F | Ht 68.58 in | Wt 184.2 lb

## 2018-02-07 DIAGNOSIS — R7989 Other specified abnormal findings of blood chemistry: Secondary | ICD-10-CM

## 2018-02-07 DIAGNOSIS — E7800 Pure hypercholesterolemia, unspecified: Secondary | ICD-10-CM

## 2018-02-07 DIAGNOSIS — E782 Mixed hyperlipidemia: Secondary | ICD-10-CM

## 2018-02-07 DIAGNOSIS — N401 Enlarged prostate with lower urinary tract symptoms: Secondary | ICD-10-CM

## 2018-02-07 DIAGNOSIS — Z0000 Encounter for general adult medical examination without abnormal findings: Secondary | ICD-10-CM | POA: Diagnosis not present

## 2018-02-07 DIAGNOSIS — N138 Other obstructive and reflux uropathy: Secondary | ICD-10-CM

## 2018-02-07 DIAGNOSIS — Z23 Encounter for immunization: Secondary | ICD-10-CM | POA: Diagnosis not present

## 2018-02-07 MED ORDER — ATORVASTATIN CALCIUM 10 MG PO TABS: 10.0000 mg | ORAL_TABLET | Freq: Every day | ORAL | 4 refills | Status: DC

## 2018-02-07 MED ORDER — TAMSULOSIN HCL 0.4 MG PO CAPS: 0.4000 mg | ORAL_CAPSULE | Freq: Every day | ORAL | 4 refills | Status: DC

## 2018-02-07 NOTE — Assessment & Plan Note (Signed)
The current medical regimen is effective;  continue present plan and medications.  

## 2018-02-07 NOTE — Progress Notes (Signed)
BP 136/80 (BP Location: Left Arm)   Pulse 80   Temp 97.8 F (36.6 C)   Ht 5' 8.58" (1.742 m)   Wt 184 lb 4 oz (83.6 kg)   SpO2 95%   BMI 27.54 kg/m    Subjective:    Patient ID: Dennis Mclaughlin, male    DOB: [birth year], 67 y.o.   MRN: [id number]  HPI: Dennis Mclaughlin is a 66 y.o. male  Chief Complaint  Patient presents with  . Annual Exam   Patient all in all doing well has problems with plantar fasciitis was just treated by the foot doctor yesterday has new shoes and working on anti-inflammatories. Following waterworks doing well no issues with cholesterol has good urinary flow. Neuropathy and symptoms have improved Relevant past medical, surgical, family and social history reviewed and updated as indicated. Interim medical history since our last visit reviewed. Allergies and medications reviewed and updated.  Review of Systems  Constitutional: Negative.   HENT: Negative.   Eyes: Negative.   Respiratory: Negative.   Cardiovascular: Negative.   Gastrointestinal: Negative.   Endocrine: Negative.   Genitourinary: Negative.   Musculoskeletal: Negative.   Skin: Negative.   Allergic/Immunologic: Negative.   Neurological: Negative.   Hematological: Negative.   Psychiatric/Behavioral: Negative.     Per HPI unless specifically indicated above     Objective:    BP 136/80 (BP Location: Left Arm)   Pulse 80   Temp 97.8 F (36.6 C)   Ht 5' 8.58" (1.742 m)   Wt 184 lb 4 oz (83.6 kg)   SpO2 95%   BMI 27.54 kg/m   Wt Readings from Last 3 Encounters:  02/07/18 184 lb 4 oz (83.6 kg)  08/02/17 181 lb (82.1 kg)  01/10/17 178 lb (80.7 kg)    Physical Exam Constitutional:      Appearance: He is well-developed.  HENT:     Head: Normocephalic and atraumatic.     Right Ear: External ear normal.     Left Ear: External ear normal.  Eyes:     Conjunctiva/sclera: Conjunctivae normal.     Pupils: Pupils are equal, round, and reactive to light.  Neck:     Musculoskeletal:  Normal range of motion and neck supple.  Cardiovascular:     Rate and Rhythm: Normal rate and regular rhythm.     Heart sounds: Normal heart sounds.  Pulmonary:     Effort: Pulmonary effort is normal.     Breath sounds: Normal breath sounds.  Abdominal:     General: Bowel sounds are normal.     Palpations: Abdomen is soft. There is no hepatomegaly or splenomegaly.  Genitourinary:    Penis: Normal.      Prostate: Normal.     Rectum: Normal.  Musculoskeletal: Normal range of motion.  Skin:    Findings: No erythema or rash.  Neurological:     Mental Status: He is alert and oriented to person, place, and time.     Deep Tendon Reflexes: Reflexes are normal and symmetric.  Psychiatric:        Behavior: Behavior normal.        Thought Content: Thought content normal.        Judgment: Judgment normal.     Results for orders placed or performed in visit on 08/02/17  ALT  Result Value Ref Range   ALT 38 0 - 44 IU/L  AST  Result Value Ref Range   AST 29 0 - 40 IU/L  Lipid panel  Result Value Ref Range   Cholesterol, Total 211 (H) 100 - 199 mg/dL   Triglycerides 161117 0 - 149 mg/dL   HDL 48 >09>39 mg/dL   VLDL Cholesterol Cal 23 5 - 40 mg/dL   LDL Calculated 604140 (H) 0 - 99 mg/dL   Chol/HDL Ratio 4.4 0.0 - 5.0 ratio      Assessment & Plan:   Problem List Items Addressed This Visit      Genitourinary   BPH with obstruction/lower urinary tract symptoms    The current medical regimen is effective;  continue present plan and medications.       Relevant Medications   tamsulosin (FLOMAX) 0.4 MG CAPS capsule   Other Relevant Orders   PSA     Other   Hyperlipidemia    The current medical regimen is effective;  continue present plan and medications.       Relevant Medications   atorvastatin (LIPITOR) 10 MG tablet   Other Relevant Orders   Lipid Panel w/o Chol/HDL Ratio    Other Visit Diagnoses    Immunization due    -  Primary   Relevant Orders   Pneumococcal conjugate  vaccine 13-valent IM (Completed)   Elevated TSH       Relevant Orders   TSH   Annual physical exam       Relevant Orders   CBC with Differential/Platelet   Comprehensive metabolic panel   UA/M w/rflx Culture, Routine       Follow up plan: Return in about 1 year (around 02/08/2019).

## 2018-02-07 NOTE — Addendum Note (Signed)
Addended by: Vonita MossRISSMAN, Alondra Sahni A on: 02/07/2018 11:10 AM   Modules accepted: Level of Service

## 2018-02-08 LAB — COMPREHENSIVE METABOLIC PANEL
ALK PHOS: 82 IU/L (ref 39–117)
ALT: 44 IU/L (ref 0–44)
AST: 25 IU/L (ref 0–40)
Albumin/Globulin Ratio: 2.3 — ABNORMAL HIGH (ref 1.2–2.2)
Albumin: 4.8 g/dL (ref 3.6–4.8)
BUN/Creatinine Ratio: 12 (ref 10–24)
BUN: 11 mg/dL (ref 8–27)
Bilirubin Total: 0.5 mg/dL (ref 0.0–1.2)
CO2: 22 mmol/L (ref 20–29)
Calcium: 10 mg/dL (ref 8.6–10.2)
Chloride: 101 mmol/L (ref 96–106)
Creatinine, Ser: 0.95 mg/dL (ref 0.76–1.27)
GFR calc Af Amer: 97 mL/min/{1.73_m2} (ref >59)
GFR calc non Af Amer: 84 mL/min/{1.73_m2} (ref >59)
GLUCOSE: 98 mg/dL (ref 65–99)
Globulin, Total: 2.1 g/dL (ref 1.5–4.5)
Potassium: 5 mmol/L (ref 3.5–5.2)
Sodium: 142 mmol/L (ref 134–144)
Total Protein: 6.9 g/dL (ref 6.0–8.5)

## 2018-02-08 LAB — PSA: Prostate Specific Ag, Serum: 1.2 ng/mL (ref 0.0–4.0)

## 2018-02-08 LAB — CBC WITH DIFFERENTIAL/PLATELET
Basophils Absolute: 0 10*3/uL (ref 0.0–0.2)
Basos: 0 %
EOS (ABSOLUTE): 0.1 10*3/uL (ref 0.0–0.4)
Eos: 1 %
Hematocrit: 38 % (ref 37.5–51.0)
Hemoglobin: 13.4 g/dL (ref 13.0–17.7)
Immature Grans (Abs): 0 10*3/uL (ref 0.0–0.1)
Immature Granulocytes: 0 %
Lymphocytes Absolute: 1.5 10*3/uL (ref 0.7–3.1)
Lymphs: 14 %
MCH: 32 pg (ref 26.6–33.0)
MCHC: 35.3 g/dL (ref 31.5–35.7)
MCV: 91 fL (ref 79–97)
MONOS ABS: 0.9 10*3/uL (ref 0.1–0.9)
Monocytes: 9 %
Neutrophils Absolute: 8 10*3/uL — ABNORMAL HIGH (ref 1.4–7.0)
Neutrophils: 76 %
Platelets: 205 10*3/uL (ref 150–450)
RBC: 4.19 x10E6/uL (ref 4.14–5.80)
RDW: 12 % — ABNORMAL LOW (ref 12.3–15.4)
WBC: 10.6 10*3/uL (ref 3.4–10.8)

## 2018-02-08 LAB — LIPID PANEL W/O CHOL/HDL RATIO
Cholesterol, Total: 208 mg/dL — ABNORMAL HIGH (ref 100–199)
HDL: 51 mg/dL (ref >39)
LDL Calculated: 141 mg/dL — ABNORMAL HIGH (ref 0–99)
Triglycerides: 78 mg/dL (ref 0–149)
VLDL Cholesterol Cal: 16 mg/dL (ref 5–40)

## 2018-02-08 LAB — TSH: TSH: 3.18 u[IU]/mL (ref 0.450–4.500)

## 2018-02-11 ENCOUNTER — Encounter: Payer: Self-pay | Admitting: Family Medicine

## 2018-03-28 ENCOUNTER — Encounter: Payer: Self-pay | Admitting: Nurse Practitioner

## 2018-03-28 ENCOUNTER — Other Ambulatory Visit: Payer: Self-pay

## 2018-03-28 ENCOUNTER — Ambulatory Visit (INDEPENDENT_AMBULATORY_CARE_PROVIDER_SITE_OTHER): Payer: BLUE CROSS/BLUE SHIELD | Admitting: Nurse Practitioner

## 2018-03-28 VITALS — BP 139/85 | HR 66 | Temp 98.0°F | Ht 70.0 in | Wt 186.0 lb

## 2018-03-28 DIAGNOSIS — J40 Bronchitis, not specified as acute or chronic: Secondary | ICD-10-CM | POA: Insufficient documentation

## 2018-03-28 MED ORDER — AMOXICILLIN-POT CLAVULANATE 875-125 MG PO TABS: 1.0000 | ORAL_TABLET | Freq: Two times a day (BID) | ORAL | 0 refills | Status: AC

## 2018-03-28 NOTE — Assessment & Plan Note (Signed)
Acute with cough x 3 weeks.  He refuses imaging today, but agrees to return if worsening or continued symptoms.  Script for Augmentin sent.  Recommend continuing Mucinex and taking Coricidin as needed until symptom improvement.  Increase fluid intake at home.  Discussed reasons to be seen immediately, such as SOB or CP.

## 2018-03-28 NOTE — Progress Notes (Signed)
BP 139/85   Pulse 66   Temp 98 F (36.7 C) (Oral)   Ht 5\' 10"  (1.778 m)   Wt 186 lb (84.4 kg)   SpO2 96%   BMI 26.69 kg/m    Subjective:    Patient ID: Dennis Mclaughlin, Dennis Mclaughlin    DOB: 01/04/1951, 68 y.o.   MRN: [id number]  HPI: Dennis Mclaughlin is a 68 y.o. Dennis Mclaughlin  Chief Complaint  Patient presents with  . Cough    x 3 weeks. productive cough. yellowish phlegm. tried OTC medication  . Sore Throat  . Nasal Congestion   UPPER RESPIRATORY TRACT INFECTION Presents with complaint of productive cough x 3 weeks, yellowish phlegm.  He is not a smoker.  Has been taking Mucinex, Coricidin D, and multiple other medications.  Discussed benefit of obtaining CXR since cough has been present  For 3 weeks, he refuses to have one performed at this time.  Agrees to return to office if worsening or continued symptoms present, then will obtain imaging. Worst symptom: cough Fever: no Cough: yes Shortness of breath: no Wheezing: no Chest pain: no Chest tightness: yes Chest congestion: yes Nasal congestion: yes Runny nose: yes Post nasal drip: yes Sneezing: yes Sore throat: yes Swollen glands: no Sinus pressure: yes Headache: yes Face pain: no Toothache: no Ear pain: none Ear pressure: yes bilateral Eyes red/itching:no Eye drainage/crusting: no  Vomiting: no Rash: no Fatigue: yes Sick contacts: yes Strep contacts: no  Context: worse Recurrent sinusitis: no Relief with OTC cold/cough medications: no  Treatments attempted: cold/sinus, mucinex and cough syrup   Relevant past medical, surgical, family and social history reviewed and updated as indicated. Interim medical history since our last visit reviewed. Allergies and medications reviewed and updated.  Review of Systems  Constitutional: Positive for fatigue. Negative for activity change, diaphoresis and fever.  HENT: Positive for congestion, postnasal drip, rhinorrhea, sinus pressure, sneezing and sore throat. Negative for ear  discharge, ear pain, sinus pain and voice change.   Eyes: Negative for pain and visual disturbance.  Respiratory: Positive for cough and chest tightness. Negative for shortness of breath and wheezing.   Cardiovascular: Negative for chest pain, palpitations and leg swelling.  Gastrointestinal: Negative for abdominal distention, abdominal pain, constipation, diarrhea, nausea and vomiting.  Endocrine: Negative for cold intolerance, heat intolerance, polydipsia, polyphagia and polyuria.  Musculoskeletal: Negative.   Skin: Negative.   Neurological: Negative for dizziness, syncope, weakness, light-headedness, numbness and headaches.  Psychiatric/Behavioral: Negative.     Per HPI unless specifically indicated above     Objective:    BP 139/85   Pulse 66   Temp 98 F (36.7 C) (Oral)   Ht 5\' 10"  (1.778 m)   Wt 186 lb (84.4 kg)   SpO2 96%   BMI 26.69 kg/m   Wt Readings from Last 3 Encounters:  03/28/18 186 lb (84.4 kg)  02/07/18 184 lb 4 oz (83.6 kg)  08/02/17 181 lb (82.1 kg)    Physical Exam Vitals signs and nursing note reviewed.  Constitutional:      General: He is awake.     Appearance: He is well-developed.  HENT:     Head: Normocephalic and atraumatic.     Right Ear: Hearing, ear canal and external ear normal. No drainage. A middle ear effusion is present.     Left Ear: Hearing, ear canal and external ear normal. No drainage. A middle ear effusion is present.     Nose: Mucosal edema and rhinorrhea present.  Rhinorrhea is clear.     Right Sinus: No maxillary sinus tenderness or frontal sinus tenderness.     Left Sinus: No maxillary sinus tenderness or frontal sinus tenderness.     Mouth/Throat:     Mouth: Mucous membranes are moist.     Pharynx: Uvula midline. Posterior oropharyngeal erythema (mild with cobblestoning) present. No pharyngeal swelling or oropharyngeal exudate.  Eyes:     General: Lids are normal.        Right eye: No discharge.        Left eye: No discharge.      Conjunctiva/sclera: Conjunctivae normal.     Pupils: Pupils are equal, round, and reactive to light.  Neck:     Musculoskeletal: Normal range of motion and neck supple.     Thyroid: No thyromegaly.     Vascular: No carotid bruit or JVD.     Trachea: Trachea normal.  Cardiovascular:     Rate and Rhythm: Normal rate and regular rhythm.     Heart sounds: Normal heart sounds, S1 normal and S2 normal. No murmur. No gallop.   Pulmonary:     Effort: Pulmonary effort is normal.     Breath sounds: Normal breath sounds.     Comments: Clear throughout, no adventitious sounds.  Intermittent productive cough present. Abdominal:     General: Bowel sounds are normal.     Palpations: Abdomen is soft. There is no hepatomegaly or splenomegaly.  Musculoskeletal: Normal range of motion.     Right lower leg: No edema.     Left lower leg: No edema.  Skin:    General: Skin is warm and dry.     Capillary Refill: Capillary refill takes less than 2 seconds.     Findings: No rash.  Neurological:     Mental Status: He is alert and oriented to person, place, and time.     Deep Tendon Reflexes: Reflexes are normal and symmetric.  Psychiatric:        Mood and Affect: Mood normal.        Behavior: Behavior normal. Behavior is cooperative.        Thought Content: Thought content normal.        Judgment: Judgment normal.     Results for orders placed or performed in visit on 02/07/18  CBC with Differential/Platelet  Result Value Ref Range   WBC 10.6 3.4 - 10.8 x10E3/uL   RBC 4.19 4.14 - 5.80 x10E6/uL   Hemoglobin 13.4 13.0 - 17.7 g/dL   Hematocrit 48.8 89.1 - 51.0 %   MCV 91 79 - 97 fL   MCH 32.0 26.6 - 33.0 pg   MCHC 35.3 31.5 - 35.7 g/dL   RDW 69.4 (L) 50.3 - 88.8 %   Platelets 205 150 - 450 x10E3/uL   Neutrophils 76 Not Estab. %   Lymphs 14 Not Estab. %   Monocytes 9 Not Estab. %   Eos 1 Not Estab. %   Basos 0 Not Estab. %   Neutrophils Absolute 8.0 (H) 1.4 - 7.0 x10E3/uL   Lymphocytes  Absolute 1.5 0.7 - 3.1 x10E3/uL   Monocytes Absolute 0.9 0.1 - 0.9 x10E3/uL   EOS (ABSOLUTE) 0.1 0.0 - 0.4 x10E3/uL   Basophils Absolute 0.0 0.0 - 0.2 x10E3/uL   Immature Granulocytes 0 Not Estab. %   Immature Grans (Abs) 0.0 0.0 - 0.1 x10E3/uL  Comprehensive metabolic panel  Result Value Ref Range   Glucose 98 65 - 99 mg/dL   BUN 11 8 -  27 mg/dL   Creatinine, Ser 1.610.95 0.76 - 1.27 mg/dL   GFR calc non Af Amer 84 >59 mL/min/1.73   GFR calc Af Amer 97 >59 mL/min/1.73   BUN/Creatinine Ratio 12 10 - 24   Sodium 142 134 - 144 mmol/L   Potassium 5.0 3.5 - 5.2 mmol/L   Chloride 101 96 - 106 mmol/L   CO2 22 20 - 29 mmol/L   Calcium 10.0 8.6 - 10.2 mg/dL   Total Protein 6.9 6.0 - 8.5 g/dL   Albumin 4.8 3.6 - 4.8 g/dL   Globulin, Total 2.1 1.5 - 4.5 g/dL   Albumin/Globulin Ratio 2.3 (H) 1.2 - 2.2   Bilirubin Total 0.5 0.0 - 1.2 mg/dL   Alkaline Phosphatase 82 39 - 117 IU/L   AST 25 0 - 40 IU/L   ALT 44 0 - 44 IU/L  Lipid Panel w/o Chol/HDL Ratio  Result Value Ref Range   Cholesterol, Total 208 (H) 100 - 199 mg/dL   Triglycerides 78 0 - 149 mg/dL   HDL 51 >09>39 mg/dL   VLDL Cholesterol Cal 16 5 - 40 mg/dL   LDL Calculated 604141 (H) 0 - 99 mg/dL  TSH  Result Value Ref Range   TSH 3.180 0.450 - 4.500 uIU/mL  PSA  Result Value Ref Range   Prostate Specific Ag, Serum 1.2 0.0 - 4.0 ng/mL      Assessment & Plan:   Problem List Items Addressed This Visit      Respiratory   Bronchitis - Primary    Acute with cough x 3 weeks.  He refuses imaging today, but agrees to return if worsening or continued symptoms.  Script for Augmentin sent.  Recommend continuing Mucinex and taking Coricidin as needed until symptom improvement.  Increase fluid intake at home.  Discussed reasons to be seen immediately, such as SOB or CP.           Follow up plan: Return if symptoms worsen or fail to improve.

## 2018-03-28 NOTE — Patient Instructions (Signed)
Acute Bronchitis, Adult Acute bronchitis is when air tubes (bronchi) in the lungs suddenly get swollen. The condition can make it hard to breathe. It can also cause these symptoms:  A cough.  Coughing up clear, yellow, or green mucus.  Wheezing.  Chest congestion.  Shortness of breath.  A fever.  Body aches.  Chills.  A sore throat. Follow these instructions at home:  Medicines  Take over-the-counter and prescription medicines only as told by your doctor.  If you were prescribed an antibiotic medicine, take it as told by your doctor. Do not stop taking the antibiotic even if you start to feel better. General instructions  Rest.  Drink enough fluids to keep your pee (urine) pale yellow.  Avoid smoking and secondhand smoke. If you smoke and you need help quitting, ask your doctor. Quitting will help your lungs heal faster.  Use an inhaler, cool mist vaporizer, or humidifier as told by your doctor.  Keep all follow-up visits as told by your doctor. This is important. How is this prevented? To lower your risk of getting this condition again:  Wash your hands often with soap and water. If you cannot use soap and water, use hand sanitizer.  Avoid contact with people who have cold symptoms.  Try not to touch your hands to your mouth, nose, or eyes.  Make sure to get the flu shot every year. Contact a doctor if:  Your symptoms do not get better in 2 weeks. Get help right away if:  You cough up blood.  You have chest pain.  You have very bad shortness of breath.  You become dehydrated.  You faint (pass out) or keep feeling like you are going to pass out.  You keep throwing up (vomiting).  You have a very bad headache.  Your fever or chills gets worse. This information is not intended to replace advice given to you by your health care provider. Make sure you discuss any questions you have with your health care provider. Document Released: 08/02/2007 Document  Revised: 09/27/2016 Document Reviewed: 08/04/2015 Elsevier Interactive Patient Education  2019 [employer]  

## 2018-08-12 ENCOUNTER — Encounter: Payer: Self-pay | Admitting: Family Medicine

## 2018-08-12 ENCOUNTER — Other Ambulatory Visit: Payer: Self-pay

## 2018-08-12 ENCOUNTER — Ambulatory Visit (INDEPENDENT_AMBULATORY_CARE_PROVIDER_SITE_OTHER): Payer: BC Managed Care – PPO | Admitting: Family Medicine

## 2018-08-12 DIAGNOSIS — E782 Mixed hyperlipidemia: Secondary | ICD-10-CM | POA: Diagnosis not present

## 2018-08-12 DIAGNOSIS — N138 Other obstructive and reflux uropathy: Secondary | ICD-10-CM

## 2018-08-12 DIAGNOSIS — G5793 Unspecified mononeuropathy of bilateral lower limbs: Secondary | ICD-10-CM | POA: Diagnosis not present

## 2018-08-12 DIAGNOSIS — N401 Enlarged prostate with lower urinary tract symptoms: Secondary | ICD-10-CM

## 2018-08-12 NOTE — Assessment & Plan Note (Signed)
The current medical regimen is effective;  continue present plan and medications.  

## 2018-08-12 NOTE — Progress Notes (Signed)
There were no vitals taken for this visit.   Subjective:    Patient ID: Dennis Mclaughlin, male    DOB: [birth year], 68 y.o.   MRN: [id number]  HPI: Dennis Mclaughlin is a 68 y.o. male  Med f/u  Discussed with patient doing well with no BPH symptoms taking tamsulosin more on a as needed basis without problems. Thyroid no issues on lab review had normal TSH. Taking cholesterol medicines without any problems and good control. No neuropathy complaints  Relevant past medical, surgical, family and social history reviewed and updated as indicated. Interim medical history since our last visit reviewed. Allergies and medications reviewed and updated.  Review of Systems  Constitutional: Negative.   Respiratory: Negative.   Cardiovascular: Negative.     Per HPI unless specifically indicated above     Objective:    There were no vitals taken for this visit.  Wt Readings from Last 3 Encounters:  03/28/18 186 lb (84.4 kg)  02/07/18 184 lb 4 oz (83.6 kg)  08/02/17 181 lb (82.1 kg)    Physical Exam  Results for orders placed or performed in visit on 02/07/18  CBC with Differential/Platelet  Result Value Ref Range   WBC 10.6 3.4 - 10.8 x10E3/uL   RBC 4.19 4.14 - 5.80 x10E6/uL   Hemoglobin 13.4 13.0 - 17.7 g/dL   Hematocrit 38.0 37.5 - 51.0 %   MCV 91 79 - 97 fL   MCH 32.0 26.6 - 33.0 pg   MCHC 35.3 31.5 - 35.7 g/dL   RDW 12.0 (L) 12.3 - 15.4 %   Platelets 205 150 - 450 x10E3/uL   Neutrophils 76 Not Estab. %   Lymphs 14 Not Estab. %   Monocytes 9 Not Estab. %   Eos 1 Not Estab. %   Basos 0 Not Estab. %   Neutrophils Absolute 8.0 (H) 1.4 - 7.0 x10E3/uL   Lymphocytes Absolute 1.5 0.7 - 3.1 x10E3/uL   Monocytes Absolute 0.9 0.1 - 0.9 x10E3/uL   EOS (ABSOLUTE) 0.1 0.0 - 0.4 x10E3/uL   Basophils Absolute 0.0 0.0 - 0.2 x10E3/uL   Immature Granulocytes 0 Not Estab. %   Immature Grans (Abs) 0.0 0.0 - 0.1 x10E3/uL  Comprehensive metabolic panel  Result Value Ref Range   Glucose 98 65  - 99 mg/dL   BUN 11 8 - 27 mg/dL   Creatinine, Ser 0.95 0.76 - 1.27 mg/dL   GFR calc non Af Amer 84 >59 mL/min/1.73   GFR calc Af Amer 97 >59 mL/min/1.73   BUN/Creatinine Ratio 12 10 - 24   Sodium 142 134 - 144 mmol/L   Potassium 5.0 3.5 - 5.2 mmol/L   Chloride 101 96 - 106 mmol/L   CO2 22 20 - 29 mmol/L   Calcium 10.0 8.6 - 10.2 mg/dL   Total Protein 6.9 6.0 - 8.5 g/dL   Albumin 4.8 3.6 - 4.8 g/dL   Globulin, Total 2.1 1.5 - 4.5 g/dL   Albumin/Globulin Ratio 2.3 (H) 1.2 - 2.2   Bilirubin Total 0.5 0.0 - 1.2 mg/dL   Alkaline Phosphatase 82 39 - 117 IU/L   AST 25 0 - 40 IU/L   ALT 44 0 - 44 IU/L  Lipid Panel w/o Chol/HDL Ratio  Result Value Ref Range   Cholesterol, Total 208 (H) 100 - 199 mg/dL   Triglycerides 78 0 - 149 mg/dL   HDL 51 >39 mg/dL   VLDL Cholesterol Cal 16 5 - 40 mg/dL   LDL Calculated 141 (  H) 0 - 99 mg/dL  TSH  Result Value Ref Range   TSH 3.180 0.450 - 4.500 uIU/mL  PSA  Result Value Ref Range   Prostate Specific Ag, Serum 1.2 0.0 - 4.0 ng/mL      Assessment & Plan:   Problem List Items Addressed This Visit      Nervous and Auditory   Neuropathy of leg    The current medical regimen is effective;  continue present plan and medications.         Genitourinary   BPH with obstruction/lower urinary tract symptoms    The current medical regimen is effective;  continue present plan and medications.         Other   Hyperlipidemia    The current medical regimen is effective;  continue present plan and medications.          Telemedicine using audio/video telecommunications for a synchronous communication visit. Today's visit due to COVID-19 isolation precautions I connected with and verified that I am speaking with the correct person using two identifiers.   I discussed the limitations, risks, security and privacy concerns of performing an evaluation and management service by telecommunication and the availability of in person appointments. I also  discussed with the patient that there may be a patient responsible charge related to this service. The patient expressed understanding and agreed to proceed. The patient's location is home. I am at home.   I discussed the assessment and treatment plan with the patient. The patient was provided an opportunity to ask questions and all were answered. The patient agreed with the plan and demonstrated an understanding of the instructions.   The patient was advised to call back or seek an in-person evaluation if the symptoms worsen or if the condition fails to improve as anticipated.   I provided 21+ minutes of time during this encounter. Follow up plan: Return in about 6 months (around 02/11/2019) for Physical Exam.

## 2018-10-29 ENCOUNTER — Other Ambulatory Visit: Payer: Self-pay

## 2018-10-29 ENCOUNTER — Ambulatory Visit (INDEPENDENT_AMBULATORY_CARE_PROVIDER_SITE_OTHER): Payer: BC Managed Care – PPO

## 2018-10-29 DIAGNOSIS — Z23 Encounter for immunization: Secondary | ICD-10-CM | POA: Diagnosis not present

## 2018-12-25 ENCOUNTER — Other Ambulatory Visit: Payer: Self-pay

## 2018-12-25 ENCOUNTER — Ambulatory Visit (INDEPENDENT_AMBULATORY_CARE_PROVIDER_SITE_OTHER): Payer: BC Managed Care – PPO | Admitting: Family Medicine

## 2018-12-25 ENCOUNTER — Encounter: Payer: Self-pay | Admitting: Family Medicine

## 2018-12-25 VITALS — BP 148/83 | HR 75 | Temp 98.0°F | Ht 70.0 in | Wt 179.0 lb

## 2018-12-25 DIAGNOSIS — N401 Enlarged prostate with lower urinary tract symptoms: Secondary | ICD-10-CM | POA: Diagnosis not present

## 2018-12-25 DIAGNOSIS — N138 Other obstructive and reflux uropathy: Secondary | ICD-10-CM

## 2018-12-25 DIAGNOSIS — I10 Essential (primary) hypertension: Secondary | ICD-10-CM | POA: Diagnosis not present

## 2018-12-25 DIAGNOSIS — E7800 Pure hypercholesterolemia, unspecified: Secondary | ICD-10-CM | POA: Diagnosis not present

## 2018-12-25 DIAGNOSIS — J3089 Other allergic rhinitis: Secondary | ICD-10-CM | POA: Diagnosis not present

## 2018-12-25 DIAGNOSIS — J309 Allergic rhinitis, unspecified: Secondary | ICD-10-CM | POA: Insufficient documentation

## 2018-12-25 MED ORDER — FLUTICASONE PROPIONATE 50 MCG/ACT NA SUSP: 1.0000 | Freq: Two times a day (BID) | NASAL | 6 refills | Status: DC

## 2018-12-25 MED ORDER — AMLODIPINE BESYLATE 5 MG PO TABS: 5.0000 mg | ORAL_TABLET | Freq: Every day | ORAL | 0 refills | Status: DC

## 2018-12-25 NOTE — Assessment & Plan Note (Signed)
BPs consistently elevated on recent home checks as well as in office. Will start low dose amlodipine and start monitoring home readings. Recheck in about 2 weeks

## 2018-12-25 NOTE — Assessment & Plan Note (Signed)
Due for labs, will check labs and adjust as needed

## 2018-12-25 NOTE — Assessment & Plan Note (Signed)
May feel better about restarting flomax once allergies are under better control.

## 2018-12-25 NOTE — Progress Notes (Signed)
BP (!) 148/83   Pulse 75   Temp 98 F (36.7 C) (Oral)   Ht 5\' 10"  (1.778 m)   Wt 179 lb (81.2 kg)   SpO2 98%   BMI 25.68 kg/m    Subjective:    Patient ID: Dennis Mclaughlin, male    DOB: 1950/11/21, 68 y.o.   MRN: [id number]  HPI: Dennis Mclaughlin is a 68 y.o. male  Chief Complaint  Patient presents with  . Hypertension    pt states his BP has been running high, first noticed yesterday   Here today for concerns about HTN. Home pressures 150/90s-100s the last few days since he started checking home readings. Last night was 170s/114. Does have some significant added stressors right now with his mom's health and memory declining. Denies Cp, SOB, HAs, dizziness. Has never taken anything for BPs in the past.   Using benadryl BID right now and afrin severe several times daily, feeling constantly stuffed up in his sinuses. Denies fevers, chills, CP, SOB, wheezing.   Supposed to be taking flomax for his BPH but stopped due to his dry, irritated sinuses as he states it seemed to make that worse. Plans to start back taking it once the allergy issues calm down.   Relevant past medical, surgical, family and social history reviewed and updated as indicated. Interim medical history since our last visit reviewed. Allergies and medications reviewed and updated.  Review of Systems  Per HPI unless specifically indicated above     Objective:    BP (!) 148/83   Pulse 75   Temp 98 F (36.7 C) (Oral)   Ht 5\' 10"  (1.778 m)   Wt 179 lb (81.2 kg)   SpO2 98%   BMI 25.68 kg/m   Wt Readings from Last 3 Encounters:  12/25/18 179 lb (81.2 kg)  03/28/18 186 lb (84.4 kg)  02/07/18 184 lb 4 oz (83.6 kg)    Physical Exam Vitals signs and nursing note reviewed.  Constitutional:      Appearance: Normal appearance.  HENT:     Head: Atraumatic.  Eyes:     Extraocular Movements: Extraocular movements intact.     Conjunctiva/sclera: Conjunctivae normal.  Neck:     Musculoskeletal: Normal range of  motion and neck supple.  Cardiovascular:     Rate and Rhythm: Normal rate and regular rhythm.  Pulmonary:     Effort: Pulmonary effort is normal.     Breath sounds: Normal breath sounds.  Musculoskeletal: Normal range of motion.  Skin:    General: Skin is warm and dry.  Neurological:     General: No focal deficit present.     Mental Status: He is oriented to person, place, and time.  Psychiatric:        Mood and Affect: Mood normal.        Thought Content: Thought content normal.        Judgment: Judgment normal.     Results for orders placed or performed in visit on 02/07/18  CBC with Differential/Platelet  Result Value Ref Range   WBC 10.6 3.4 - 10.8 x10E3/uL   RBC 4.19 4.14 - 5.80 x10E6/uL   Hemoglobin 13.4 13.0 - 17.7 g/dL   Hematocrit 14/12/19 14/12/19 - 51.0 %   MCV 91 79 - 97 fL   MCH 32.0 26.6 - 33.0 pg   MCHC 35.3 31.5 - 35.7 g/dL   RDW 66.4 (L) 40.3 - 47.4 %   Platelets 205 150 - 450 x10E3/uL  Neutrophils 76 Not Estab. %   Lymphs 14 Not Estab. %   Monocytes 9 Not Estab. %   Eos 1 Not Estab. %   Basos 0 Not Estab. %   Neutrophils Absolute 8.0 (H) 1.4 - 7.0 x10E3/uL   Lymphocytes Absolute 1.5 0.7 - 3.1 x10E3/uL   Monocytes Absolute 0.9 0.1 - 0.9 x10E3/uL   EOS (ABSOLUTE) 0.1 0.0 - 0.4 x10E3/uL   Basophils Absolute 0.0 0.0 - 0.2 x10E3/uL   Immature Granulocytes 0 Not Estab. %   Immature Grans (Abs) 0.0 0.0 - 0.1 x10E3/uL  Comprehensive metabolic panel  Result Value Ref Range   Glucose 98 65 - 99 mg/dL   BUN 11 8 - 27 mg/dL   Creatinine, Ser 0.95 0.76 - 1.27 mg/dL   GFR calc non Af Amer 84 >59 mL/min/1.73   GFR calc Af Amer 97 >59 mL/min/1.73   BUN/Creatinine Ratio 12 10 - 24   Sodium 142 134 - 144 mmol/L   Potassium 5.0 3.5 - 5.2 mmol/L   Chloride 101 96 - 106 mmol/L   CO2 22 20 - 29 mmol/L   Calcium 10.0 8.6 - 10.2 mg/dL   Total Protein 6.9 6.0 - 8.5 g/dL   Albumin 4.8 3.6 - 4.8 g/dL   Globulin, Total 2.1 1.5 - 4.5 g/dL   Albumin/Globulin Ratio 2.3 (H) 1.2  - 2.2   Bilirubin Total 0.5 0.0 - 1.2 mg/dL   Alkaline Phosphatase 82 39 - 117 IU/L   AST 25 0 - 40 IU/L   ALT 44 0 - 44 IU/L  Lipid Panel w/o Chol/HDL Ratio  Result Value Ref Range   Cholesterol, Total 208 (H) 100 - 199 mg/dL   Triglycerides 78 0 - 149 mg/dL   HDL 51 >39 mg/dL   VLDL Cholesterol Cal 16 5 - 40 mg/dL   LDL Calculated 141 (H) 0 - 99 mg/dL  TSH  Result Value Ref Range   TSH 3.180 0.450 - 4.500 uIU/mL  PSA  Result Value Ref Range   Prostate Specific Ag, Serum 1.2 0.0 - 4.0 ng/mL      Assessment & Plan:   Problem List Items Addressed This Visit      Cardiovascular and Mediastinum   Essential hypertension    BPs consistently elevated on recent home checks as well as in office. Will start low dose amlodipine and start monitoring home readings. Recheck in about 2 weeks      Relevant Medications   amLODipine (NORVASC) 5 MG tablet   Other Relevant Orders   Comprehensive metabolic panel     Respiratory   Allergic rhinitis    Will start flonase BID, switch to claritin or zyrtec rather than benadryl he's currently on. Sinus rinses, avoiding afrin reviewed        Genitourinary   BPH with obstruction/lower urinary tract symptoms    May feel better about restarting flomax once allergies are under better control.         Other   Hyperlipidemia - Primary    Due for labs, will check labs and adjust as needed      Relevant Medications   amLODipine (NORVASC) 5 MG tablet   Other Relevant Orders   Lipid Panel w/o Chol/HDL Ratio       Follow up plan: Return in about 2 weeks (around 01/08/2019) for BP f/u.

## 2018-12-25 NOTE — Assessment & Plan Note (Signed)
Will start flonase BID, switch to claritin or zyrtec rather than benadryl he's currently on. Sinus rinses, avoiding afrin reviewed

## 2018-12-26 LAB — COMPREHENSIVE METABOLIC PANEL
ALT: 28 IU/L (ref 0–44)
AST: 23 IU/L (ref 0–40)
Albumin/Globulin Ratio: 2.3 — ABNORMAL HIGH (ref 1.2–2.2)
Albumin: 4.6 g/dL (ref 3.8–4.8)
Alkaline Phosphatase: 89 IU/L (ref 39–117)
BUN/Creatinine Ratio: 14 (ref 10–24)
BUN: 12 mg/dL (ref 8–27)
Bilirubin Total: 0.3 mg/dL (ref 0.0–1.2)
CO2: 22 mmol/L (ref 20–29)
Calcium: 9.6 mg/dL (ref 8.6–10.2)
Chloride: 105 mmol/L (ref 96–106)
Creatinine, Ser: 0.86 mg/dL (ref 0.76–1.27)
GFR calc Af Amer: 104 mL/min/{1.73_m2} (ref >59)
GFR calc non Af Amer: 90 mL/min/{1.73_m2} (ref >59)
Globulin, Total: 2 g/dL (ref 1.5–4.5)
Glucose: 100 mg/dL — ABNORMAL HIGH (ref 65–99)
Potassium: 4.3 mmol/L (ref 3.5–5.2)
Sodium: 140 mmol/L (ref 134–144)
Total Protein: 6.6 g/dL (ref 6.0–8.5)

## 2018-12-26 LAB — LIPID PANEL W/O CHOL/HDL RATIO
Cholesterol, Total: 176 mg/dL (ref 100–199)
HDL: 47 mg/dL (ref >39)
LDL Chol Calc (NIH): 114 mg/dL — ABNORMAL HIGH (ref 0–99)
Triglycerides: 83 mg/dL (ref 0–149)
VLDL Cholesterol Cal: 15 mg/dL (ref 5–40)

## 2019-01-03 ENCOUNTER — Other Ambulatory Visit: Payer: Self-pay

## 2019-01-03 DIAGNOSIS — Z20822 Contact with and (suspected) exposure to covid-19: Secondary | ICD-10-CM

## 2019-01-05 LAB — NOVEL CORONAVIRUS, NAA: SARS-CoV-2, NAA: NOT DETECTED

## 2019-01-06 ENCOUNTER — Telehealth: Payer: Self-pay | Admitting: General Practice

## 2019-01-06 NOTE — Telephone Encounter (Signed)
Gave patient covid test results °Patient understood  °

## 2019-01-08 ENCOUNTER — Other Ambulatory Visit: Payer: Self-pay

## 2019-01-09 ENCOUNTER — Ambulatory Visit (INDEPENDENT_AMBULATORY_CARE_PROVIDER_SITE_OTHER): Payer: BC Managed Care – PPO | Admitting: Family Medicine

## 2019-01-09 ENCOUNTER — Encounter: Payer: Self-pay | Admitting: Family Medicine

## 2019-01-09 ENCOUNTER — Other Ambulatory Visit: Payer: Self-pay

## 2019-01-09 VITALS — BP 118/74 | HR 71 | Temp 98.1°F | Ht 70.0 in | Wt 176.0 lb

## 2019-01-09 DIAGNOSIS — I10 Essential (primary) hypertension: Secondary | ICD-10-CM | POA: Diagnosis not present

## 2019-01-09 MED ORDER — AMLODIPINE BESYLATE 5 MG PO TABS: 5.0000 mg | ORAL_TABLET | Freq: Every day | ORAL | 0 refills | Status: DC

## 2019-01-09 NOTE — Progress Notes (Signed)
   BP 118/74   Pulse 71   Temp 98.1 F (36.7 C) (Oral)   Ht 5\' 10"  (1.778 m)   Wt 176 lb (79.8 kg)   SpO2 99%   BMI 25.25 kg/m    Subjective:    Patient ID: Dennis Mclaughlin, male    DOB: [birth year], 68 y.o.   MRN: [id number]  HPI: Dennis Mclaughlin is a 68 y.o. male  Chief Complaint  Patient presents with  . Hypertension   Patient presenting today for 2 week f/u HTN after adding low dose amlodipine. Has been checking home readings with a wrist cuff and getting readings 150-170/70s-90s. Tolerating the medicine well without side effects. Denies CP, SOB, HAs, dizziness. Trying to eat better and stay active.   Relevant past medical, surgical, family and social history reviewed and updated as indicated. Interim medical history since our last visit reviewed. Allergies and medications reviewed and updated.  Review of Systems  Per HPI unless specifically indicated above     Objective:    BP 118/74   Pulse 71   Temp 98.1 F (36.7 C) (Oral)   Ht 5\' 10"  (1.778 m)   Wt 176 lb (79.8 kg)   SpO2 99%   BMI 25.25 kg/m   Wt Readings from Last 3 Encounters:  01/09/19 176 lb (79.8 kg)  12/25/18 179 lb (81.2 kg)  03/28/18 186 lb (84.4 kg)    Physical Exam Vitals signs and nursing note reviewed.  Constitutional:      Appearance: Normal appearance.  HENT:     Head: Atraumatic.  Eyes:     Extraocular Movements: Extraocular movements intact.     Conjunctiva/sclera: Conjunctivae normal.  Neck:     Musculoskeletal: Normal range of motion and neck supple.  Cardiovascular:     Rate and Rhythm: Normal rate and regular rhythm.  Pulmonary:     Effort: Pulmonary effort is normal.     Breath sounds: Normal breath sounds.  Musculoskeletal: Normal range of motion.  Skin:    General: Skin is warm and dry.  Neurological:     General: No focal deficit present.     Mental Status: He is oriented to person, place, and time.  Psychiatric:        Mood and Affect: Mood normal.        Thought  Content: Thought content normal.        Judgment: Judgment normal.     Results for orders placed or performed in visit on 01/03/19  Novel Coronavirus, NAA ([facility])   Specimen: Oropharyngeal(OP) collection in vial transport medium   OROPHARYNGEA  TESTING  Result Value Ref Range   SARS-CoV-2, NAA Not Detected Not Detected      Assessment & Plan:   Problem List Items Addressed This Visit      Cardiovascular and Mediastinum   Essential hypertension - Primary    BP today in office WNL, unclear if home machine reading high or if today's reading is an outlier. Will have him bring in his home machine to check against manual reading. Continue current regimen and close monitoring of readings. Call with persistent abnormal readings      Relevant Medications   amLODipine (NORVASC) 5 MG tablet       Follow up plan: Return in about 4 weeks (around 02/06/2019) for HTN.

## 2019-01-13 NOTE — Assessment & Plan Note (Signed)
BP today in office WNL, unclear if home machine reading high or if today's reading is an outlier. Will have him bring in his home machine to check against manual reading. Continue current regimen and close monitoring of readings. Call with persistent abnormal readings

## 2019-02-13 ENCOUNTER — Encounter: Payer: BC Managed Care – PPO | Admitting: Family Medicine

## 2019-02-17 ENCOUNTER — Other Ambulatory Visit: Payer: Self-pay

## 2019-02-17 ENCOUNTER — Ambulatory Visit (INDEPENDENT_AMBULATORY_CARE_PROVIDER_SITE_OTHER): Payer: BC Managed Care – PPO | Admitting: Family Medicine

## 2019-02-17 ENCOUNTER — Encounter: Payer: Self-pay | Admitting: Family Medicine

## 2019-02-17 VITALS — BP 136/81 | HR 74 | Temp 98.2°F | Ht 69.5 in | Wt 177.0 lb

## 2019-02-17 DIAGNOSIS — I10 Essential (primary) hypertension: Secondary | ICD-10-CM

## 2019-02-17 DIAGNOSIS — N138 Other obstructive and reflux uropathy: Secondary | ICD-10-CM | POA: Diagnosis not present

## 2019-02-17 DIAGNOSIS — Z23 Encounter for immunization: Secondary | ICD-10-CM

## 2019-02-17 DIAGNOSIS — N401 Enlarged prostate with lower urinary tract symptoms: Secondary | ICD-10-CM

## 2019-02-17 DIAGNOSIS — Z1159 Encounter for screening for other viral diseases: Secondary | ICD-10-CM | POA: Diagnosis not present

## 2019-02-17 DIAGNOSIS — E782 Mixed hyperlipidemia: Secondary | ICD-10-CM

## 2019-02-17 DIAGNOSIS — Z0000 Encounter for general adult medical examination without abnormal findings: Secondary | ICD-10-CM | POA: Diagnosis not present

## 2019-02-17 LAB — UA/M W/RFLX CULTURE, ROUTINE
Bilirubin, UA: NEGATIVE
Glucose, UA: NEGATIVE
Ketones, UA: NEGATIVE
Leukocytes,UA: NEGATIVE
Nitrite, UA: NEGATIVE
Protein,UA: NEGATIVE
RBC, UA: NEGATIVE
Specific Gravity, UA: 1.02 (ref 1.005–1.030)
Urobilinogen, Ur: 0.2 mg/dL (ref 0.2–1.0)
pH, UA: 5.5 (ref 5.0–7.5)

## 2019-02-17 NOTE — Progress Notes (Signed)
BP 136/81   Pulse 74   Temp 98.2 F (36.8 C) (Oral)   Ht 5' 9.5" (1.765 m)   Wt 177 lb (80.3 kg)   SpO2 97%   BMI 25.76 kg/m    Subjective:    Patient ID: Dennis Mclaughlin, male    DOB: 1950/03/21, 68 y.o.   MRN: [id number]  HPI: Dennis Mclaughlin is a 68 y.o. male presenting on 02/17/2019 for comprehensive medical examination. Current medical complaints include:see below  HTN - Checks BPs on occasion at home, typically getting 130/80 range. Denies CP, SOB, HAs, dizziness. Taking medication faithfully without side effects.  HLD - taking lipitor, tolerating well. Trying to watch what he eats. Does not exercise regularly.   BPH - Used to be on flomax, does not wish to restart. Urologist retired last year and has not found another provider. Continues to have stream difficulties and occasional prostate tenderness.   He currently lives with: Interim Problems from his last visit: no  Depression Screen done today and results listed below:  Depression screen [facility] 2/9 02/17/2019 12/25/2018 08/02/2017 01/10/2017 01/10/2016  Decreased Interest 0 0 0 0 0  Down, Depressed, Hopeless 0 0 0 0 0  PHQ - 2 Score 0 0 0 0 0  Altered sleeping 0 0 - - -  Tired, decreased energy 0 0 - - -  Change in appetite 0 0 - - -  Feeling bad or failure about yourself  0 0 - - -  Trouble concentrating 0 0 - - -  Moving slowly or fidgety/restless 0 0 - - -  Suicidal thoughts 0 0 - - -  PHQ-9 Score 0 0 - - -    The patient does not have a history of falls. I did complete a risk assessment for falls. A plan of care for falls was documented.   Past Medical History:  Past Medical History:  Diagnosis Date  . History of prostatitis   . Hyperlipidemia   . Low testosterone     Surgical History:  Past Surgical History:  Procedure Laterality Date  . BICEPT TENODESIS Right 04/12/2015   Procedure: OPEN BICEPS TENODESIS;  Surgeon: Tania Ade, MD;  Location: [facility];  Service: Orthopedics;   Laterality: Right;  . COLONOSCOPY W/ BIOPSIES    . SHOULDER ARTHROSCOPY WITH DISTAL CLAVICLE RESECTION Right 04/12/2015   Procedure: SHOULDER ARTHROSCOPY WITH DISTAL CLAVICLE RESECTION;  Surgeon: Tania Ade, MD;  Location: [facility];  Service: Orthopedics;  Laterality: Right;  . SHOULDER ARTHROSCOPY WITH SUBACROMIAL DECOMPRESSION Right 04/12/2015   Procedure: SHOULDER ARTHROSCOPY WITH SUBACROMIAL DECOMPRESSION DEBRIDEMENT LABRAL TEAR;  Surgeon: Tania Ade, MD;  Location: [facility];  Service: Orthopedics;  Laterality: Right;    Medications:  Current Outpatient Medications on File Prior to Visit  Medication Sig  . amLODipine (NORVASC) 5 MG tablet Take 1 tablet (5 mg total) by mouth daily.  . Ascorbic Acid (VITAMIN C PO) Take by mouth daily.  Marland Kitchen aspirin EC 81 MG tablet Take 81 mg by mouth daily.  Marland Kitchen atorvastatin (LIPITOR) 10 MG tablet Take 1 tablet (10 mg total) by mouth daily.  . Chlorpheniramine Maleate (ALLERGY PO) Take by mouth 2 (two) times daily.  . Multiple Vitamins-Minerals (CENTRUM SILVER PO) Take by mouth daily.  . Omega-3 Fatty Acids (FISH OIL PO) Take 1,200 mg by mouth 2 (two) times daily.   No current facility-administered medications on file prior to visit.    Allergies:  No Known Allergies  Social History:  Social History   Socioeconomic History  . Marital status: Married    Spouse name: Not on file  . Number of children: Not on file  . Years of education: Not on file  . Highest education level: Not on file  Occupational History  . Not on file  Tobacco Use  . Smoking status: Former Smoker    Types: Cigarettes    Quit date: 01/11/1990    Years since quitting: 29.1  . Smokeless tobacco: Never Used  Substance and Sexual Activity  . Alcohol use: Yes    Comment: socially  . Drug use: No  . Sexual activity: Not on file  Other Topics Concern  . Not on file  Social History Narrative  . Not on file   Social Determinants of  Health   Financial Resource Strain:   . Difficulty of Paying Living Expenses: Not on file  Food Insecurity:   . Worried About Programme researcher, broadcasting/film/videounning Out of Food in the Last Year: Not on file  . Ran Out of Food in the Last Year: Not on file  Transportation Needs:   . Lack of Transportation (Medical): Not on file  . Lack of Transportation (Non-Medical): Not on file  Physical Activity:   . Days of Exercise per Week: Not on file  . Minutes of Exercise per Session: Not on file  Stress:   . Feeling of Stress : Not on file  Social Connections:   . Frequency of Communication with Friends and Family: Not on file  . Frequency of Social Gatherings with Friends and Family: Not on file  . Attends Religious Services: Not on file  . Active Member of Clubs or Organizations: Not on file  . Attends BankerClub or Organization Meetings: Not on file  . Marital Status: Not on file  Intimate Partner Violence:   . Fear of Current or Ex-Partner: Not on file  . Emotionally Abused: Not on file  . Physically Abused: Not on file  . Sexually Abused: Not on file   Social History   Tobacco Use  Smoking Status Former Smoker  . Types: Cigarettes  . Quit date: 01/11/1990  . Years since quitting: 29.1  Smokeless Tobacco Never Used   Social History   Substance and Sexual Activity  Alcohol Use Yes   Comment: socially    Family History:  Family History  Problem Relation Age of Onset  . Hypertension Mother   . Diabetes Maternal Aunt   . Hearing loss Paternal Uncle   . Stroke Paternal Uncle   . Cancer Paternal Grandfather        prostate  . COPD Neg Hx     Past medical history, surgical history, medications, allergies, family history and social history reviewed with patient today and changes made to appropriate areas of the chart.   Review of Systems - General ROS: negative Psychological ROS: negative Ophthalmic ROS: negative ENT ROS: negative Allergy and Immunology ROS: negative Hematological and Lymphatic ROS:  negative Endocrine ROS: negative Breast ROS: negative for breast lumps Respiratory ROS: no cough, shortness of breath, or wheezing Cardiovascular ROS: no chest pain or dyspnea on exertion Gastrointestinal ROS: no abdominal pain, change in bowel habits, or black or bloody stools Genito-Urinary ROS: positive for - change in urinary stream and nocturia Musculoskeletal ROS: negative Neurological ROS: no TIA or stroke symptoms Dermatological ROS: negative All other ROS negative except what is listed above and in the HPI.      Objective:    BP 136/81  Pulse 74   Temp 98.2 F (36.8 C) (Oral)   Ht 5' 9.5" (1.765 m)   Wt 177 lb (80.3 kg)   SpO2 97%   BMI 25.76 kg/m   Wt Readings from Last 3 Encounters:  02/17/19 177 lb (80.3 kg)  01/09/19 176 lb (79.8 kg)  12/25/18 179 lb (81.2 kg)    Physical Exam Vitals and nursing note reviewed.  Constitutional:      General: He is not in acute distress.    Appearance: He is well-developed.  HENT:     Head: Atraumatic.     Right Ear: Tympanic membrane and external ear normal.     Left Ear: Tympanic membrane and external ear normal.     Nose: Nose normal.     Mouth/Throat:     Mouth: Mucous membranes are moist.     Pharynx: Oropharynx is clear.  Eyes:     General: No scleral icterus.    Conjunctiva/sclera: Conjunctivae normal.     Pupils: Pupils are equal, round, and reactive to light.  Cardiovascular:     Rate and Rhythm: Normal rate and regular rhythm.     Heart sounds: Normal heart sounds. No murmur.  Pulmonary:     Effort: Pulmonary effort is normal. No respiratory distress.     Breath sounds: Normal breath sounds.  Abdominal:     General: Bowel sounds are normal. There is no distension.     Palpations: Abdomen is soft. There is no mass.     Tenderness: There is no abdominal tenderness. There is no guarding.  Genitourinary:    Comments: Prostate enlarged, no nodules palpable. Symmetric Musculoskeletal:        General: No  tenderness. Normal range of motion.     Cervical back: Normal range of motion and neck supple.  Skin:    General: Skin is warm and dry.     Findings: No rash.  Neurological:     General: No focal deficit present.     Mental Status: He is alert and oriented to person, place, and time.     Deep Tendon Reflexes: Reflexes are normal and symmetric.  Psychiatric:        Mood and Affect: Mood normal.        Behavior: Behavior normal.        Thought Content: Thought content normal.        Judgment: Judgment normal.     Results for orders placed or performed in visit on 02/17/19  Hepatitis C Antibody  Result Value Ref Range   Hep C Virus Ab <0.1 0.0 - 0.9 s/co ratio  Comprehensive metabolic panel  Result Value Ref Range   Glucose 107 (H) 65 - 99 mg/dL   BUN 13 8 - 27 mg/dL   Creatinine, Ser 1.61 0.76 - 1.27 mg/dL   GFR calc non Af Amer 85 >59 mL/min/1.73   GFR calc Af Amer 99 >59 mL/min/1.73   BUN/Creatinine Ratio 14 10 - 24   Sodium 139 134 - 144 mmol/L   Potassium 4.3 3.5 - 5.2 mmol/L   Chloride 102 96 - 106 mmol/L   CO2 22 20 - 29 mmol/L   Calcium 9.5 8.6 - 10.2 mg/dL   Total Protein 6.6 6.0 - 8.5 g/dL   Albumin 4.7 3.8 - 4.8 g/dL   Globulin, Total 1.9 1.5 - 4.5 g/dL   Albumin/Globulin Ratio 2.5 (H) 1.2 - 2.2   Bilirubin Total 0.5 0.0 - 1.2 mg/dL   Alkaline Phosphatase 87 39 -  117 IU/L   AST 29 0 - 40 IU/L   ALT 34 0 - 44 IU/L  Lipid Panel w/o Chol/HDL Ratio out  Result Value Ref Range   Cholesterol, Total 181 100 - 199 mg/dL   Triglycerides 528 0 - 149 mg/dL   HDL 45 >41 mg/dL   VLDL Cholesterol Cal 21 5 - 40 mg/dL   LDL Chol Calc (NIH) 324 (H) 0 - 99 mg/dL  UA/M w/rflx Culture, Routine   Specimen: Urine   URINE  Result Value Ref Range   Specific Gravity, UA 1.020 1.005 - 1.030   pH, UA 5.5 5.0 - 7.5   Color, UA Yellow Yellow   Appearance Ur Clear Clear   Leukocytes,UA Negative Negative   Protein,UA Negative Negative/Trace   Glucose, UA Negative Negative    Ketones, UA Negative Negative   RBC, UA Negative Negative   Bilirubin, UA Negative Negative   Urobilinogen, Ur 0.2 0.2 - 1.0 mg/dL   Nitrite, UA Negative Negative  CBC with Differential/Platelet out  Result Value Ref Range   WBC 7.1 3.4 - 10.8 x10E3/uL   RBC 4.48 4.14 - 5.80 x10E6/uL   Hemoglobin 14.1 13.0 - 17.7 g/dL   Hematocrit 40.1 02.7 - 51.0 %   MCV 94 79 - 97 fL   MCH 31.5 26.6 - 33.0 pg   MCHC 33.5 31.5 - 35.7 g/dL   RDW 25.3 66.4 - 40.3 %   Platelets 217 150 - 450 x10E3/uL   Neutrophils 62 Not Estab. %   Lymphs 26 Not Estab. %   Monocytes 9 Not Estab. %   Eos 2 Not Estab. %   Basos 1 Not Estab. %   Neutrophils Absolute 4.4 1.4 - 7.0 x10E3/uL   Lymphocytes Absolute 1.8 0.7 - 3.1 x10E3/uL   Monocytes Absolute 0.6 0.1 - 0.9 x10E3/uL   EOS (ABSOLUTE) 0.1 0.0 - 0.4 x10E3/uL   Basophils Absolute 0.0 0.0 - 0.2 x10E3/uL   Immature Granulocytes 0 Not Estab. %   Immature Grans (Abs) 0.0 0.0 - 0.1 x10E3/uL  PSA  Result Value Ref Range   Prostate Specific Ag, Serum 1.0 0.0 - 4.0 ng/mL      Assessment & Plan:   Problem List Items Addressed This Visit      Cardiovascular and Mediastinum   Essential hypertension    BPs stable and WNL, continue current regimen      Relevant Orders   Comprehensive metabolic panel (Completed)   CBC with Differential/Platelet out (Completed)     Genitourinary   BPH with obstruction/lower urinary tract symptoms    Referral placed to establish with new Urologist as previous provider retired. Declines starting new medication today despite definitive sxs. Continue to push fluids, f/u if worsening sxs      Relevant Orders   UA/M w/rflx Culture, Routine (Completed)   PSA (Completed)   Ambulatory referral to Urology     Other   Hyperlipidemia - Primary    Recheck lipids, adjust as needed. Continue working on lifestyle modifications      Relevant Orders   Lipid Panel w/o Chol/HDL Ratio out (Completed)    Other Visit Diagnoses    Annual  physical exam       Need for pneumococcal vaccine       Relevant Orders   Pneumococcal polysaccharide vaccine 23-valent greater than or equal to 2yo subcutaneous/IM (Completed)   Need for hepatitis C screening test       Relevant Orders   Hepatitis C Antibody (Completed)  Discussed aspirin prophylaxis for myocardial infarction prevention and decision was made to continue ASA  LABORATORY TESTING:  Health maintenance labs ordered today as discussed above.   The natural history of prostate cancer and ongoing controversy regarding screening and potential treatment outcomes of prostate cancer has been discussed with the patient. The meaning of a false positive PSA and a false negative PSA has been discussed. He indicates understanding of the limitations of this screening test and wishes to to proceed with screening PSA testing.   IMMUNIZATIONS:   - Tdap: Tetanus vaccination status reviewed: last tetanus booster within 10 years. - Influenza: Up to date - Pneumovax: Up to date - Prevnar: Administered today - HPV: Not applicable - Zostavax vaccine: plans to get in the future  SCREENING: - Colonoscopy: Up to date  Discussed with patient purpose of the colonoscopy is to detect colon cancer at curable precancerous or early stages   PATIENT COUNSELING:    Sexuality: Discussed sexually transmitted diseases, partner selection, use of condoms, avoidance of unintended pregnancy  and contraceptive alternatives.   Advised to avoid cigarette smoking.  I discussed with the patient that most people either abstain from alcohol or drink within safe limits (<=14/week and <=4 drinks/occasion for males, <=7/weeks and <= 3 drinks/occasion for females) and that the risk for alcohol disorders and other health effects rises proportionally with the number of drinks per week and how often a drinker exceeds daily limits.  Discussed cessation/primary prevention of drug use and availability of treatment for  abuse.   Diet: Encouraged to adjust caloric intake to maintain  or achieve ideal body weight, to reduce intake of dietary saturated fat and total fat, to limit sodium intake by avoiding high sodium foods and not adding table salt, and to maintain adequate dietary potassium and calcium preferably from fresh fruits, vegetables, and low-fat dairy products.    stressed the importance of regular exercise  Injury prevention: Discussed safety belts, safety helmets, smoke detector, smoking near bedding or upholstery.   Dental health: Discussed importance of regular tooth brushing, flossing, and dental visits.   Follow up plan: NEXT PREVENTATIVE PHYSICAL DUE IN 1 YEAR. Return in about 6 months (around 08/18/2019) for 6 month f/u.

## 2019-02-18 LAB — CBC WITH DIFFERENTIAL/PLATELET
Basophils Absolute: 0 10*3/uL (ref 0.0–0.2)
Basos: 1 %
EOS (ABSOLUTE): 0.1 10*3/uL (ref 0.0–0.4)
Eos: 2 %
Hematocrit: 42.1 % (ref 37.5–51.0)
Hemoglobin: 14.1 g/dL (ref 13.0–17.7)
Immature Grans (Abs): 0 10*3/uL (ref 0.0–0.1)
Immature Granulocytes: 0 %
Lymphocytes Absolute: 1.8 10*3/uL (ref 0.7–3.1)
Lymphs: 26 %
MCH: 31.5 pg (ref 26.6–33.0)
MCHC: 33.5 g/dL (ref 31.5–35.7)
MCV: 94 fL (ref 79–97)
Monocytes Absolute: 0.6 10*3/uL (ref 0.1–0.9)
Monocytes: 9 %
Neutrophils Absolute: 4.4 10*3/uL (ref 1.4–7.0)
Neutrophils: 62 %
Platelets: 217 10*3/uL (ref 150–450)
RBC: 4.48 x10E6/uL (ref 4.14–5.80)
RDW: 11.8 % (ref 11.6–15.4)
WBC: 7.1 10*3/uL (ref 3.4–10.8)

## 2019-02-18 LAB — COMPREHENSIVE METABOLIC PANEL
ALT: 34 IU/L (ref 0–44)
AST: 29 IU/L (ref 0–40)
Albumin/Globulin Ratio: 2.5 — ABNORMAL HIGH (ref 1.2–2.2)
Albumin: 4.7 g/dL (ref 3.8–4.8)
Alkaline Phosphatase: 87 IU/L (ref 39–117)
BUN/Creatinine Ratio: 14 (ref 10–24)
BUN: 13 mg/dL (ref 8–27)
Bilirubin Total: 0.5 mg/dL (ref 0.0–1.2)
CO2: 22 mmol/L (ref 20–29)
Calcium: 9.5 mg/dL (ref 8.6–10.2)
Chloride: 102 mmol/L (ref 96–106)
Creatinine, Ser: 0.93 mg/dL (ref 0.76–1.27)
GFR calc Af Amer: 99 mL/min/{1.73_m2} (ref >59)
GFR calc non Af Amer: 85 mL/min/{1.73_m2} (ref >59)
Globulin, Total: 1.9 g/dL (ref 1.5–4.5)
Glucose: 107 mg/dL — ABNORMAL HIGH (ref 65–99)
Potassium: 4.3 mmol/L (ref 3.5–5.2)
Sodium: 139 mmol/L (ref 134–144)
Total Protein: 6.6 g/dL (ref 6.0–8.5)

## 2019-02-18 LAB — LIPID PANEL W/O CHOL/HDL RATIO
Cholesterol, Total: 181 mg/dL (ref 100–199)
HDL: 45 mg/dL (ref >39)
LDL Chol Calc (NIH): 115 mg/dL — ABNORMAL HIGH (ref 0–99)
Triglycerides: 114 mg/dL (ref 0–149)
VLDL Cholesterol Cal: 21 mg/dL (ref 5–40)

## 2019-02-18 LAB — PSA: Prostate Specific Ag, Serum: 1 ng/mL (ref 0.0–4.0)

## 2019-02-18 LAB — HEPATITIS C ANTIBODY: Hep C Virus Ab: <0.1 s/co ratio (ref 0.0–0.9)

## 2019-02-23 NOTE — Assessment & Plan Note (Signed)
BPs stable and WNL, continue current regimen 

## 2019-02-23 NOTE — Assessment & Plan Note (Signed)
Recheck lipids, adjust as needed. Continue working on lifestyle modifications 

## 2019-02-23 NOTE — Assessment & Plan Note (Signed)
Referral placed to establish with new Urologist as previous provider retired. Declines starting new medication today despite definitive sxs. Continue to push fluids, f/u if worsening sxs

## 2019-03-26 ENCOUNTER — Ambulatory Visit: Payer: BC Managed Care – PPO | Admitting: Urology

## 2019-04-22 ENCOUNTER — Other Ambulatory Visit: Payer: Self-pay | Admitting: Family Medicine

## 2019-04-22 DIAGNOSIS — E782 Mixed hyperlipidemia: Secondary | ICD-10-CM

## 2019-04-22 DIAGNOSIS — E7800 Pure hypercholesterolemia, unspecified: Secondary | ICD-10-CM

## 2019-04-22 NOTE — Telephone Encounter (Signed)
Medication Refill - Medication:  Amlodipine 5 mg Atorvastatin 10 mg  Has the patient contacted their pharmacy? No. (Agent: If no, request that the patient contact the pharmacy for the refill.) (Agent: If yes, when and what did the pharmacy advise?)  Preferred Pharmacy (with phone number or street name): [facility]  Agent: Please be advised that RX refills may take up to 3 business days. We ask that you follow-up with your pharmacy.

## 2019-04-22 NOTE — Telephone Encounter (Signed)
LOV: 02/17/20 Next Appt: 08/18/19

## 2019-04-23 MED ORDER — AMLODIPINE BESYLATE 5 MG PO TABS: 5.0000 mg | ORAL_TABLET | Freq: Every day | ORAL | 0 refills | Status: DC

## 2019-04-23 MED ORDER — ATORVASTATIN CALCIUM 10 MG PO TABS: 10.0000 mg | ORAL_TABLET | Freq: Every day | ORAL | 0 refills | Status: DC

## 2019-05-15 DIAGNOSIS — R69 Illness, unspecified: Secondary | ICD-10-CM | POA: Diagnosis not present

## 2019-07-21 DIAGNOSIS — S0500XA Injury of conjunctiva and corneal abrasion without foreign body, unspecified eye, initial encounter: Secondary | ICD-10-CM | POA: Diagnosis not present

## 2019-08-04 ENCOUNTER — Other Ambulatory Visit: Payer: Self-pay | Admitting: Family Medicine

## 2019-08-04 DIAGNOSIS — E782 Mixed hyperlipidemia: Secondary | ICD-10-CM

## 2019-08-04 DIAGNOSIS — E7800 Pure hypercholesterolemia, unspecified: Secondary | ICD-10-CM

## 2019-08-04 MED ORDER — ATORVASTATIN CALCIUM 10 MG PO TABS: 10.0000 mg | ORAL_TABLET | Freq: Every day | ORAL | 0 refills | Status: DC

## 2019-08-04 NOTE — Telephone Encounter (Signed)
Medication Refill - Medication: atorvastatin (LIPITOR) 10 MG tablet    Has the patient contacted their pharmacy? Yes.   (Agent: If no, request that the patient contact the pharmacy for the refill.) (Agent: If yes, when and what did the pharmacy advise?)  Preferred Pharmacy (with phone number or street name):  [id number] [place], [place] - [address]  [address] [place] [address]  Phone: [phone] Fax: [phone]     Agent: Please be advised that RX refills may take up to 3 business days. We ask that you follow-up with your pharmacy.  

## 2019-08-04 NOTE — Telephone Encounter (Signed)
Approved per protocol.  

## 2019-08-18 ENCOUNTER — Ambulatory Visit (INDEPENDENT_AMBULATORY_CARE_PROVIDER_SITE_OTHER): Payer: Medicare HMO | Admitting: Family Medicine

## 2019-08-18 ENCOUNTER — Encounter: Payer: Self-pay | Admitting: Family Medicine

## 2019-08-18 ENCOUNTER — Other Ambulatory Visit: Payer: Self-pay

## 2019-08-18 VITALS — BP 129/75 | HR 64 | Temp 98.2°F | Wt 172.0 lb

## 2019-08-18 DIAGNOSIS — N138 Other obstructive and reflux uropathy: Secondary | ICD-10-CM

## 2019-08-18 DIAGNOSIS — Z730 Burn-out: Secondary | ICD-10-CM | POA: Diagnosis not present

## 2019-08-18 DIAGNOSIS — N401 Enlarged prostate with lower urinary tract symptoms: Secondary | ICD-10-CM | POA: Diagnosis not present

## 2019-08-18 DIAGNOSIS — E782 Mixed hyperlipidemia: Secondary | ICD-10-CM | POA: Diagnosis not present

## 2019-08-18 DIAGNOSIS — Z125 Encounter for screening for malignant neoplasm of prostate: Secondary | ICD-10-CM | POA: Diagnosis not present

## 2019-08-18 DIAGNOSIS — F419 Anxiety disorder, unspecified: Secondary | ICD-10-CM | POA: Insufficient documentation

## 2019-08-18 DIAGNOSIS — R69 Illness, unspecified: Secondary | ICD-10-CM | POA: Diagnosis not present

## 2019-08-18 DIAGNOSIS — E7800 Pure hypercholesterolemia, unspecified: Secondary | ICD-10-CM

## 2019-08-18 DIAGNOSIS — I10 Essential (primary) hypertension: Secondary | ICD-10-CM | POA: Diagnosis not present

## 2019-08-18 MED ORDER — ATORVASTATIN CALCIUM 10 MG PO TABS: 10.0000 mg | ORAL_TABLET | Freq: Every day | ORAL | 1 refills | Status: DC

## 2019-08-18 MED ORDER — ALPRAZOLAM 0.25 MG PO TABS
0.2500 mg | ORAL_TABLET | Freq: Every day | ORAL | 0 refills | Status: DC | PRN
Start: 2019-08-18 — End: 2021-10-10

## 2019-08-18 MED ORDER — AMLODIPINE BESYLATE 5 MG PO TABS: 5.0000 mg | ORAL_TABLET | Freq: Every day | ORAL | 1 refills | Status: DC

## 2019-08-18 NOTE — Assessment & Plan Note (Signed)
BPs stable and WNL, continue current regimen 

## 2019-08-18 NOTE — Assessment & Plan Note (Signed)
Sxs stable, requests repeat PSA

## 2019-08-18 NOTE — Assessment & Plan Note (Addendum)
Fervently declines daily preventatives, though discussed at length importance of underlying anxiety control. Will give small supply of low dose xanax - 20 tabs should last 3 months or longer. Pt agreeable to this and aware of precautions/risks.

## 2019-08-18 NOTE — Assessment & Plan Note (Signed)
Stable, recheck lipids and adjust if needed. Continue current regimen 

## 2019-08-18 NOTE — Progress Notes (Signed)
BP 129/75   Pulse 64   Temp 98.2 F (36.8 C) (Oral)   Wt 172 lb (78 kg)   SpO2 98%   BMI 25.04 kg/m    Subjective:    Patient ID: Dennis Mclaughlin, male    DOB: [birth year], 69 y.o.   MRN: [id number]  HPI: Dennis Mclaughlin is a 69 y.o. male  Chief Complaint  Patient presents with  . Hypertension  . Hyperlipidemia   Here today for 6 month f/u chronic conditions.   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 States there are days/moments where she is fairly lucid but other times where she is mean, aggressive, agitated and he feels constantly on edge, having palpitations and feeling anxious. Has dealt with anxiety off and on for years. Has been on numerous benzodiazepines in the past. Does the best on low dose xanax. Failed SSRIs in the past due to diarrhea, dizziness, etc. Does not wish to add a daily medication on at this time. Denies SI/HI, mood concerns.   HTN - taking medication faithfully without side effects. Denies CP, SOB, HAs, dizziness. Eating healthy and trying to stay active.   HLD - on lipitor, denies myalgias, claudication.   Depression screen [facility] 2/9 02/17/2019 12/25/2018 08/02/2017  Decreased Interest 0 0 0  Down, Depressed, Hopeless 0 0 0  PHQ - 2 Score 0 0 0  Altered sleeping 0 0 -  Tired, decreased energy 0 0 -  Change in appetite 0 0 -  Feeling bad or failure about yourself  0 0 -  Trouble concentrating 0 0 -  Moving slowly or fidgety/restless 0 0 -  Suicidal thoughts 0 0 -  PHQ-9 Score 0 0 -   GAD 7 : Generalized Anxiety Score 02/17/2019  Nervous, Anxious, on Edge 0  Control/stop worrying 0  Worry too much - different things 0  Trouble relaxing 0  Restless 0  Easily annoyed or irritable 0  Afraid - awful might happen 0  Total GAD 7 Score 0   Relevant past medical, surgical, family and social history reviewed and updated as indicated. Interim medical history since our last visit  reviewed. Allergies and medications reviewed and updated.  Review of Systems  Per HPI unless specifically indicated above     Objective:    BP 129/75   Pulse 64   Temp 98.2 F (36.8 C) (Oral)   Wt 172 lb (78 kg)   SpO2 98%   BMI 25.04 kg/m   Wt Readings from Last 3 Encounters:  08/18/19 172 lb (78 kg)  02/17/19 177 lb (80.3 kg)  01/09/19 176 lb (79.8 kg)    Physical Exam Vitals and nursing note reviewed.  Constitutional:      Appearance: Normal appearance.  HENT:     Head: Atraumatic.  Eyes:     Extraocular Movements: Extraocular movements intact.     Conjunctiva/sclera: Conjunctivae normal.  Cardiovascular:     Rate and Rhythm: Normal rate and regular rhythm.  Pulmonary:     Effort: Pulmonary effort is normal.     Breath sounds: Normal breath sounds.  Musculoskeletal:        General: Normal range of motion.     Cervical back: Normal range of motion and neck supple.  Skin:    General: Skin is warm and dry.  Neurological:     General: No focal deficit present.     Mental Status:  He is oriented to person, place, and time.  Psychiatric:        Mood and Affect: Mood normal.        Thought Content: Thought content normal.        Judgment: Judgment normal.     Results for orders placed or performed in visit on 02/17/19  Hepatitis C Antibody  Result Value Ref Range   Hep C Virus Ab <0.1 0.0 - 0.9 s/co ratio  Comprehensive metabolic panel  Result Value Ref Range   Glucose 107 (H) 65 - 99 mg/dL   BUN 13 8 - 27 mg/dL   Creatinine, Ser 1.61 0.76 - 1.27 mg/dL   GFR calc non Af Amer 85 >59 mL/min/1.73   GFR calc Af Amer 99 >59 mL/min/1.73   BUN/Creatinine Ratio 14 10 - 24   Sodium 139 134 - 144 mmol/L   Potassium 4.3 3.5 - 5.2 mmol/L   Chloride 102 96 - 106 mmol/L   CO2 22 20 - 29 mmol/L   Calcium 9.5 8.6 - 10.2 mg/dL   Total Protein 6.6 6.0 - 8.5 g/dL   Albumin 4.7 3.8 - 4.8 g/dL   Globulin, Total 1.9 1.5 - 4.5 g/dL   Albumin/Globulin Ratio 2.5 (H) 1.2 -  2.2   Bilirubin Total 0.5 0.0 - 1.2 mg/dL   Alkaline Phosphatase 87 39 - 117 IU/L   AST 29 0 - 40 IU/L   ALT 34 0 - 44 IU/L  Lipid Panel w/o Chol/HDL Ratio out  Result Value Ref Range   Cholesterol, Total 181 100 - 199 mg/dL   Triglycerides 096 0 - 149 mg/dL   HDL 45 >04 mg/dL   VLDL Cholesterol Cal 21 5 - 40 mg/dL   LDL Chol Calc (NIH) 540 (H) 0 - 99 mg/dL  UA/M w/rflx Culture, Routine   Specimen: Urine   URINE  Result Value Ref Range   Specific Gravity, UA 1.020 1.005 - 1.030   pH, UA 5.5 5.0 - 7.5   Color, UA Yellow Yellow   Appearance Ur Clear Clear   Leukocytes,UA Negative Negative   Protein,UA Negative Negative/Trace   Glucose, UA Negative Negative   Ketones, UA Negative Negative   RBC, UA Negative Negative   Bilirubin, UA Negative Negative   Urobilinogen, Ur 0.2 0.2 - 1.0 mg/dL   Nitrite, UA Negative Negative  CBC with Differential/Platelet out  Result Value Ref Range   WBC 7.1 3.4 - 10.8 x10E3/uL   RBC 4.48 4.14 - 5.80 x10E6/uL   Hemoglobin 14.1 13.0 - 17.7 g/dL   Hematocrit 98.1 19.1 - 51.0 %   MCV 94 79 - 97 fL   MCH 31.5 26.6 - 33.0 pg   MCHC 33.5 31 - 35 g/dL   RDW 47.8 29.5 - 62.1 %   Platelets 217 150 - 450 x10E3/uL   Neutrophils 62 Not Estab. %   Lymphs 26 Not Estab. %   Monocytes 9 Not Estab. %   Eos 2 Not Estab. %   Basos 1 Not Estab. %   Neutrophils Absolute 4.4 1 - 7 x10E3/uL   Lymphocytes Absolute 1.8 0 - 3 x10E3/uL   Monocytes Absolute 0.6 0 - 0 x10E3/uL   EOS (ABSOLUTE) 0.1 0.0 - 0.4 x10E3/uL   Basophils Absolute 0.0 0 - 0 x10E3/uL   Immature Granulocytes 0 Not Estab. %   Immature Grans (Abs) 0.0 0.0 - 0.1 x10E3/uL  PSA  Result Value Ref Range   Prostate Specific Ag, Serum 1.0 0.0 - 4.0  ng/mL      Assessment & Plan:   Problem List Items Addressed This Visit      Cardiovascular and Mediastinum   Essential hypertension - Primary    BPs stable and WNL, continue current regimen      Relevant Medications   atorvastatin (LIPITOR) 10 MG  tablet   amLODipine (NORVASC) 5 MG tablet   Other Relevant Orders   CBC with Differential/Platelet   Comprehensive metabolic panel     Genitourinary   BPH with obstruction/lower urinary tract symptoms    Sxs stable, requests repeat PSA      Relevant Orders   PSA     Other   Hyperlipidemia    Stable, recheck lipids and adjust if needed. Continue current regimen      Relevant Medications   atorvastatin (LIPITOR) 10 MG tablet   amLODipine (NORVASC) 5 MG tablet   Other Relevant Orders   Lipid Panel w/o Chol/HDL Ratio   Anxiety    Fervently declines daily preventatives, though discussed at length importance of underlying anxiety control. Will give small supply of low dose xanax - 20 tabs should last 3 months or longer. Pt agreeable to this and aware of precautions/risks.       Relevant Medications   ALPRAZolam (XANAX) 0.25 MG tablet    Other Visit Diagnoses    Burnout of caregiver       Long discussion today about care options/resources to help with his role caring for mother with Dementia. recommend CCM SW referral. Pt will consider   Relevant Medications   ALPRAZolam (XANAX) 0.25 MG tablet       Follow up plan: Return in about 3 months (around 11/18/2019) for anxiety.

## 2019-08-19 ENCOUNTER — Encounter: Payer: Self-pay | Admitting: Family Medicine

## 2019-08-19 LAB — CBC WITH DIFFERENTIAL/PLATELET
Basophils Absolute: 0 10*3/uL (ref 0.0–0.2)
Basos: 1 %
EOS (ABSOLUTE): 0.1 10*3/uL (ref 0.0–0.4)
Eos: 1 %
Hematocrit: 44.1 % (ref 37.5–51.0)
Hemoglobin: 14.8 g/dL (ref 13.0–17.7)
Immature Grans (Abs): 0 10*3/uL (ref 0.0–0.1)
Immature Granulocytes: 0 %
Lymphocytes Absolute: 1.4 10*3/uL (ref 0.7–3.1)
Lymphs: 22 %
MCH: 31.3 pg (ref 26.6–33.0)
MCHC: 33.6 g/dL (ref 31.5–35.7)
MCV: 93 fL (ref 79–97)
Monocytes Absolute: 0.5 10*3/uL (ref 0.1–0.9)
Monocytes: 8 %
Neutrophils Absolute: 4.3 10*3/uL (ref 1.4–7.0)
Neutrophils: 68 %
Platelets: 219 10*3/uL (ref 150–450)
RBC: 4.73 x10E6/uL (ref 4.14–5.80)
RDW: 12.1 % (ref 11.6–15.4)
WBC: 6.3 10*3/uL (ref 3.4–10.8)

## 2019-08-19 LAB — PSA: Prostate Specific Ag, Serum: 1.7 ng/mL (ref 0.0–4.0)

## 2019-08-19 LAB — COMPREHENSIVE METABOLIC PANEL
ALT: 26 IU/L (ref 0–44)
AST: 24 IU/L (ref 0–40)
Albumin/Globulin Ratio: 2 (ref 1.2–2.2)
Albumin: 4.7 g/dL (ref 3.8–4.8)
Alkaline Phosphatase: 98 IU/L (ref 48–121)
BUN/Creatinine Ratio: 12 (ref 10–24)
BUN: 11 mg/dL (ref 8–27)
Bilirubin Total: 0.5 mg/dL (ref 0.0–1.2)
CO2: 20 mmol/L (ref 20–29)
Calcium: 9.5 mg/dL (ref 8.6–10.2)
Chloride: 102 mmol/L (ref 96–106)
Creatinine, Ser: 0.91 mg/dL (ref 0.76–1.27)
GFR calc Af Amer: 101 mL/min/{1.73_m2} (ref >59)
GFR calc non Af Amer: 88 mL/min/{1.73_m2} (ref >59)
Globulin, Total: 2.3 g/dL (ref 1.5–4.5)
Glucose: 102 mg/dL — ABNORMAL HIGH (ref 65–99)
Potassium: 4.2 mmol/L (ref 3.5–5.2)
Sodium: 137 mmol/L (ref 134–144)
Total Protein: 7 g/dL (ref 6.0–8.5)

## 2019-08-19 LAB — LIPID PANEL W/O CHOL/HDL RATIO
Cholesterol, Total: 182 mg/dL (ref 100–199)
HDL: 47 mg/dL (ref >39)
LDL Chol Calc (NIH): 115 mg/dL — ABNORMAL HIGH (ref 0–99)
Triglycerides: 111 mg/dL (ref 0–149)
VLDL Cholesterol Cal: 20 mg/dL (ref 5–40)

## 2019-10-27 DIAGNOSIS — M9901 Segmental and somatic dysfunction of cervical region: Secondary | ICD-10-CM | POA: Diagnosis not present

## 2019-10-27 DIAGNOSIS — M9902 Segmental and somatic dysfunction of thoracic region: Secondary | ICD-10-CM | POA: Diagnosis not present

## 2019-10-27 DIAGNOSIS — M4603 Spinal enthesopathy, cervicothoracic region: Secondary | ICD-10-CM | POA: Diagnosis not present

## 2019-11-10 DIAGNOSIS — M4607 Spinal enthesopathy, lumbosacral region: Secondary | ICD-10-CM | POA: Diagnosis not present

## 2019-11-10 DIAGNOSIS — M9904 Segmental and somatic dysfunction of sacral region: Secondary | ICD-10-CM | POA: Diagnosis not present

## 2019-11-10 DIAGNOSIS — M9903 Segmental and somatic dysfunction of lumbar region: Secondary | ICD-10-CM | POA: Diagnosis not present

## 2019-11-10 DIAGNOSIS — M9902 Segmental and somatic dysfunction of thoracic region: Secondary | ICD-10-CM | POA: Diagnosis not present

## 2019-11-10 DIAGNOSIS — M4603 Spinal enthesopathy, cervicothoracic region: Secondary | ICD-10-CM | POA: Diagnosis not present

## 2019-11-10 DIAGNOSIS — M9901 Segmental and somatic dysfunction of cervical region: Secondary | ICD-10-CM | POA: Diagnosis not present

## 2019-11-18 ENCOUNTER — Ambulatory Visit: Payer: Medicare HMO | Admitting: Nurse Practitioner

## 2019-11-18 ENCOUNTER — Ambulatory Visit: Payer: Medicare HMO | Admitting: Family Medicine

## 2019-12-24 ENCOUNTER — Other Ambulatory Visit: Payer: Self-pay

## 2019-12-24 ENCOUNTER — Ambulatory Visit (INDEPENDENT_AMBULATORY_CARE_PROVIDER_SITE_OTHER): Payer: Medicare HMO

## 2019-12-24 DIAGNOSIS — Z23 Encounter for immunization: Secondary | ICD-10-CM | POA: Diagnosis not present

## 2020-03-18 ENCOUNTER — Ambulatory Visit: Payer: Self-pay | Admitting: *Deleted

## 2020-03-18 ENCOUNTER — Other Ambulatory Visit: Payer: Self-pay

## 2020-03-18 ENCOUNTER — Emergency Department
Admission: EM | Admit: 2020-03-18 | Discharge: 2020-03-18 | Disposition: A | Discharge status: Home / Self Care | Payer: Medicare HMO | Attending: Emergency Medicine | Admitting: Emergency Medicine

## 2020-03-18 ENCOUNTER — Encounter: Payer: Self-pay | Admitting: *Deleted

## 2020-03-18 ENCOUNTER — Emergency Department: Payer: Medicare HMO

## 2020-03-18 DIAGNOSIS — I10 Essential (primary) hypertension: Secondary | ICD-10-CM | POA: Insufficient documentation

## 2020-03-18 DIAGNOSIS — Z7982 Long term (current) use of aspirin: Secondary | ICD-10-CM | POA: Diagnosis not present

## 2020-03-18 DIAGNOSIS — N132 Hydronephrosis with renal and ureteral calculous obstruction: Secondary | ICD-10-CM | POA: Diagnosis not present

## 2020-03-18 DIAGNOSIS — Z87891 Personal history of nicotine dependence: Secondary | ICD-10-CM | POA: Insufficient documentation

## 2020-03-18 DIAGNOSIS — Z79899 Other long term (current) drug therapy: Secondary | ICD-10-CM | POA: Insufficient documentation

## 2020-03-18 DIAGNOSIS — R1031 Right lower quadrant pain: Secondary | ICD-10-CM

## 2020-03-18 DIAGNOSIS — N200 Calculus of kidney: Secondary | ICD-10-CM

## 2020-03-18 LAB — URINALYSIS, COMPLETE (UACMP) WITH MICROSCOPIC
Bacteria, UA: NONE SEEN
Bilirubin Urine: NEGATIVE
Glucose, UA: 50 mg/dL — AB
Ketones, ur: 5 mg/dL — AB
Leukocytes,Ua: NEGATIVE
Nitrite: NEGATIVE
Protein, ur: NEGATIVE mg/dL
RBC / HPF: >50 RBC/hpf — ABNORMAL HIGH (ref 0–5)
Specific Gravity, Urine: 1.041 — ABNORMAL HIGH (ref 1.005–1.030)
Squamous Epithelial / HPF: NONE SEEN (ref 0–5)
pH: 5 (ref 5.0–8.0)

## 2020-03-18 LAB — COMPREHENSIVE METABOLIC PANEL
ALT: 40 U/L (ref 0–44)
AST: 32 U/L (ref 15–41)
Albumin: 4.7 g/dL (ref 3.5–5.0)
Alkaline Phosphatase: 76 U/L (ref 38–126)
Anion gap: 13 (ref 5–15)
BUN: 15 mg/dL (ref 8–23)
CO2: 21 mmol/L — ABNORMAL LOW (ref 22–32)
Calcium: 9.4 mg/dL (ref 8.9–10.3)
Chloride: 98 mmol/L (ref 98–111)
Creatinine, Ser: 0.95 mg/dL (ref 0.61–1.24)
GFR, Estimated: >60 mL/min (ref >60)
Glucose, Bld: 127 mg/dL — ABNORMAL HIGH (ref 70–99)
Potassium: 4 mmol/L (ref 3.5–5.1)
Sodium: 132 mmol/L — ABNORMAL LOW (ref 135–145)
Total Bilirubin: 0.7 mg/dL (ref 0.3–1.2)
Total Protein: 7.5 g/dL (ref 6.5–8.1)

## 2020-03-18 LAB — CBC
HCT: 42.2 % (ref 39.0–52.0)
Hemoglobin: 14.7 g/dL (ref 13.0–17.0)
MCH: 32 pg (ref 26.0–34.0)
MCHC: 34.8 g/dL (ref 30.0–36.0)
MCV: 91.9 fL (ref 80.0–100.0)
Platelets: 220 10*3/uL (ref 150–400)
RBC: 4.59 MIL/uL (ref 4.22–5.81)
RDW: 11.9 % (ref 11.5–15.5)
WBC: 16.6 10*3/uL — ABNORMAL HIGH (ref 4.0–10.5)
nRBC: 0 % (ref 0.0–0.2)

## 2020-03-18 LAB — LIPASE, BLOOD: Lipase: 33 U/L (ref 11–51)

## 2020-03-18 MED ORDER — FENTANYL CITRATE (PF) 100 MCG/2ML IJ SOLN
50.0000 ug | Freq: Once | INTRAMUSCULAR | Status: AC
Start: 2020-03-18 — End: 2020-03-18
  Administered 2020-03-18: 50 ug via INTRAVENOUS
  Filled 2020-03-18: qty 2

## 2020-03-18 MED ORDER — ONDANSETRON 4 MG PO TBDP: 4.0000 mg | ORAL_TABLET | Freq: Three times a day (TID) | ORAL | 0 refills | Status: DC | PRN

## 2020-03-18 MED ORDER — KETOROLAC TROMETHAMINE 30 MG/ML IJ SOLN
30.0000 mg | Freq: Once | INTRAMUSCULAR | Status: AC
  Administered 2020-03-18: 30 mg via INTRAVENOUS
  Filled 2020-03-18: qty 1

## 2020-03-18 MED ORDER — ONDANSETRON HCL 4 MG/2ML IJ SOLN
4.0000 mg | Freq: Once | INTRAMUSCULAR | Status: AC
  Administered 2020-03-18: 4 mg via INTRAVENOUS
  Filled 2020-03-18: qty 2

## 2020-03-18 MED ORDER — IOHEXOL 300 MG/ML  SOLN
100.0000 mL | Freq: Once | INTRAMUSCULAR | Status: AC | PRN
  Administered 2020-03-18: 100 mL via INTRAVENOUS

## 2020-03-18 MED ORDER — OXYCODONE-ACETAMINOPHEN 5-325 MG PO TABS
1.0000 | ORAL_TABLET | ORAL | 0 refills | Status: DC | PRN
Start: 2020-03-18 — End: 2020-05-27

## 2020-03-18 MED ORDER — MORPHINE SULFATE (PF) 4 MG/ML IV SOLN
4.0000 mg | Freq: Once | INTRAVENOUS | Status: AC
  Administered 2020-03-18: 4 mg via INTRAVENOUS
  Filled 2020-03-18: qty 1

## 2020-03-18 NOTE — ED Notes (Signed)
Pt now reports back pain on the right side.  Pt still unable to void at this time

## 2020-03-18 NOTE — Telephone Encounter (Signed)
Noted, agree with plan for UC or ED as needs face to face visit with labs.  Please get scheduled for next week if ongoing.

## 2020-03-18 NOTE — Telephone Encounter (Signed)
Right abd pain and side pain is bad.   I've drunk 3 bottles of water since 1:00 and I'm barely peeing.    He is c/o severe right side pain from under his ribs down to his hip area and into front right abd area.  He has a history of kidney stones and an enlarged prostate gland.   He woke up with pain that has gradually gotten worse through the day.  (I could hear in his voice he was in a lot of pain).  I instructed him to go to the ED.   "I'm an only child and my mother is here with dementia".   "Everything falls on me for care of her".   I have an uncle that has dementia also. I asked him if there was family, friends, neighbors that could stay with his mother.   "I don't really have anyone".   "I could check with my neighbor".   "He might could stay with her so I can go to the [facility]".   "I don't want to go to the ED due to covid and the very long wait period".   I let him know I understood the reasoning for not going to the ED but to at least go to the [facility].   He was agreeable but wasn't sure he was going to go.   "I'll see what I can do".   "I thank you for your help and listening to me".    Reason for Disposition . [1] SEVERE pain AND [2] age > 60 years    Severe right sided pain from under rib cage to hip and into front of right abd.  Answer Assessment - Initial Assessment Questions 1. LOCATION: "Where does it hurt?"      Severe pain in right side of abd.   I've had kidney stones before this feels different.  When I got up it was hurting but as the day goes by it's getting worse.   I've had 2-3 kidney stones. 2. RADIATION: "Does the pain shoot anywhere else?" (e.g., chest, back)     Just right side below ribs to my side. 3. ONSET: "When did the pain begin?" (Minutes, hours or days ago)      Woke up with it this morning and it's gotten worse through the day.   4. SUDDEN: "Gradual or sudden onset?"     Gradually  5. PATTERN "Does the pain come and go, or is it  constant?"    - If constant: "Is it getting better, staying the same, or worsening?"      (Note: Constant means the pain never goes away completely; most serious pain is constant and it progresses)     - If intermittent: "How long does it last?" "Do you have pain now?"     (Note: Intermittent means the pain goes away completely between bouts)     It's constant. 6. SEVERITY: "How bad is the pain?"  (e.g., Scale 1-10; mild, moderate, or severe)    - MILD (1-3): doesn't interfere with normal activities, abdomen soft and not tender to touch     - MODERATE (4-7): interferes with normal activities or awakens from sleep, tender to touch     - SEVERE (8-10): excruciating pain, doubled over, unable to do any normal activities       Severe  7. RECURRENT SYMPTOM: "Have you ever had this type of stomach pain before?" If Yes, ask: "When was the last time?" and "  What happened that time?"      No diarrhea or vomiting.    8. CAUSE: "What do you think is causing the stomach pain?"     Maybe a kidney stone I don't know. 9. RELIEVING/AGGRAVATING FACTORS: "What makes it better or worse?" (e.g., movement, antacids, bowel movement)     Nothing it's just a severe pain that is getting worse. 10. OTHER SYMPTOMS: "Has there been any vomiting, diarrhea, constipation, or urine problems?"       He has a history of prostate enlargement.  Protocols used: ABDOMINAL PAIN - MALE-A-AH

## 2020-03-18 NOTE — ED Notes (Signed)
Pt unable to void at this time. 

## 2020-03-18 NOTE — ED Triage Notes (Signed)
Pt ambulatory to traige.  Pt has right side abd pain for 1 day.  No/v/d.  No back pain.  Pt alert  Speech clear.

## 2020-03-18 NOTE — ED Provider Notes (Signed)
[facility] Emergency Department Provider Note   ____________________________________________   Event Date/Time   First MD Initiated Contact with Patient 03/18/20 1754     (approximate)  I have reviewed the triage vital signs and the nursing notes.   HISTORY  Chief Complaint Abdominal Pain    HPI Dennis Mclaughlin is a 70 y.o. male with past medical history of hypertension, hyperlipidemia, and BPH who presents to the ED complaining of abdominal pain. Patient reports approximately 24 hours of worsening pain in the right lower quadrant of his abdomen. He describes it as sharp and constant, not exacerbated or alleviated by anything. It does not radiate from this location and he denies any associated nausea or vomiting. He has had decreased appetite today, denies any changes in bowel movements, dysuria, or hematuria. He denies any history of abdominal surgeries, still has his appendix.        Past Medical History:  Diagnosis Date  . History of prostatitis   . Hyperlipidemia   . Low testosterone     Patient Active Problem List   Diagnosis Date Noted  . Anxiety 08/18/2019  . Allergic rhinitis 12/25/2018  . Essential hypertension 12/25/2018  . 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 Hyperlipidemia 01/12/2015  . 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 Low testosterone 01/12/2015    Past Surgical History:  Procedure Laterality Date  . BICEPT TENODESIS Right 04/12/2015   Procedure: OPEN BICEPS TENODESIS;  Surgeon: Jones Broom, MD;  Location: [facility];  Service: Orthopedics;  Laterality: Right;  . COLONOSCOPY W/ BIOPSIES    . SHOULDER ARTHROSCOPY WITH DISTAL CLAVICLE RESECTION Right 04/12/2015   Procedure: SHOULDER ARTHROSCOPY WITH DISTAL CLAVICLE RESECTION;  Surgeon: Jones Broom, MD;  Location: [facility];  Service: Orthopedics;  Laterality: Right;  . SHOULDER ARTHROSCOPY WITH SUBACROMIAL  DECOMPRESSION Right 04/12/2015   Procedure: SHOULDER ARTHROSCOPY WITH SUBACROMIAL DECOMPRESSION DEBRIDEMENT LABRAL TEAR;  Surgeon: Jones Broom, MD;  Location: [facility];  Service: Orthopedics;  Laterality: Right;    Prior to Admission medications   Medication Sig Start Date End Date Taking? Authorizing Provider  ondansetron (ZOFRAN ODT) 4 MG disintegrating tablet Take 1 tablet (4 mg total) by mouth every 8 (eight) hours as needed for nausea or vomiting. 03/18/20  Yes Chesley Noon, MD  oxyCODONE-acetaminophen (PERCOCET) 5-325 MG tablet Take 1 tablet by mouth every 4 (four) hours as needed for severe pain. 03/18/20 03/18/21 Yes Chesley Noon, MD  ALPRAZolam Prudy Feeler) 0.25 MG tablet Take 1 tablet (0.25 mg total) by mouth daily as needed for anxiety. 08/18/19   Particia Nearing, PA-C  amLODipine (NORVASC) 5 MG tablet Take 1 tablet (5 mg total) by mouth daily. 08/18/19   Particia Nearing, PA-C  Ascorbic Acid (VITAMIN C PO) Take by mouth daily.    [provider]  aspirin EC 81 MG tablet Take 81 mg by mouth daily.    [provider]  atorvastatin (LIPITOR) 10 MG tablet Take 1 tablet (10 mg total) by mouth daily. 08/18/19   Particia Nearing, PA-C  Chlorpheniramine Maleate (ALLERGY PO) Take by mouth 2 (two) times daily.    [provider]  fluticasone (FLONASE) 50 MCG/ACT nasal spray Place 1 spray into both nostrils as needed.  07/14/19   [provider]  Multiple Vitamins-Minerals (CENTRUM SILVER PO) Take by mouth daily.    [provider]  Omega-3 Fatty Acids (FISH OIL PO) Take 1,200 mg by mouth 2 (two) times  daily.    [provider]  Oxymetazoline HCl (AFRIN NASAL SPRAY NA) Place into the nose as needed.    [provider]  VITAMIN D PO Take by mouth daily. Vitamin D3    [provider]    Allergies Patient has no known allergies.  Family History  Problem Relation Age of Onset  .  Hypertension Mother   . Diabetes Maternal Aunt   . Hearing loss Paternal Uncle   . Stroke Paternal Uncle   . Cancer Paternal Grandfather        prostate  . COPD Neg Hx     Social History Social History   Tobacco Use  . Smoking status: Former Smoker    Types: Cigarettes    Quit date: 01/11/1990    Years since quitting: 30.2  . Smokeless tobacco: Never Used  Vaping Use  . Vaping Use: Never used  Substance Use Topics  . Alcohol use: Yes    Comment: socially  . Drug use: No    Review of Systems  Constitutional: No fever/chills Eyes: No visual changes. ENT: No sore throat. Cardiovascular: Denies chest pain. Respiratory: Denies shortness of breath. Gastrointestinal: Positive for abdominal pain.  No nausea, no vomiting.  No diarrhea.  No constipation. Genitourinary: Negative for dysuria. Musculoskeletal: Negative for back pain. Skin: Negative for rash. Neurological: Negative for headaches, focal weakness or numbness.  ____________________________________________   PHYSICAL EXAM:  VITAL SIGNS: ED Triage Vitals  Enc Vitals Group     BP 03/18/20 1616 (!) 156/89     Pulse Rate 03/18/20 1616 98     Resp 03/18/20 1616 18     Temp 03/18/20 1616 98.6 F (37 C)     Temp Source 03/18/20 1616 Oral     SpO2 03/18/20 1616 99 %     Weight 03/18/20 1617 180 lb (81.6 kg)     Height 03/18/20 1617 5\' 10"  (1.778 m)     Head Circumference --      Peak Flow --      Pain Score 03/18/20 1617 8     Pain Loc --      Pain Edu? --      Excl. in GC? --     Constitutional: Alert and oriented. Eyes: Conjunctivae are normal. Head: Atraumatic. Nose: No congestion/rhinnorhea. Mouth/Throat: Mucous membranes are moist. Neck: Normal ROM Cardiovascular: Normal rate, regular rhythm. Grossly normal heart sounds. Respiratory: Normal respiratory effort.  No retractions. Lungs CTAB. Gastrointestinal: Soft and tender to palpation in the right lower quadrant with no rebound or guarding. No  distention. Genitourinary: deferred Musculoskeletal: No lower extremity tenderness nor edema. Neurologic:  Normal speech and language. No gross focal neurologic deficits are appreciated. Skin:  Skin is warm, dry and intact. No rash noted. Psychiatric: Mood and affect are normal. Speech and behavior are normal.  ____________________________________________   LABS (all labs ordered are listed, but only abnormal results are displayed)  Labs Reviewed  COMPREHENSIVE METABOLIC PANEL - Abnormal; Notable for the following components:      Result Value   Sodium 132 (*)    CO2 21 (*)    Glucose, Bld 127 (*)    All other components within normal limits  CBC - Abnormal; Notable for the following components:   WBC 16.6 (*)    All other components within normal limits  URINALYSIS, COMPLETE (UACMP) WITH MICROSCOPIC - Abnormal; Notable for the following components:   Color, Urine YELLOW (*)    APPearance CLEAR (*)  Specific Gravity, Urine 1.041 (*)    Glucose, UA 50 (*)    Hgb urine dipstick MODERATE (*)    Ketones, ur 5 (*)    RBC / HPF >50 (*)    All other components within normal limits  LIPASE, BLOOD    PROCEDURES  Procedure(s) performed (including Critical Care):  Procedures   ____________________________________________   INITIAL IMPRESSION / ASSESSMENT AND PLAN / ED COURSE       70 year old male with past medical history of hypertension, hyperlipidemia, and BPH who presents to the ED complaining of right lower quadrant abdominal pain gradually worsening for the past 24 hours. Labs reviewed and thus far remarkable only for leukocytosis, given his right lower quadrant tenderness we will further assess with CT scan for appendicitis. UA is pending but patient denies any symptoms of urinary tract infection.  CT scan shows obstructing nephrolithiasis on the right with normal appendix. Patient continued to have significant pain despite IV morphine, was subsequently given Toradol  and fentanyl with improvement. UA shows no signs of infection and patient is appropriate for discharge home with urology follow-up. He was counseled to return to the ED for any new or worsening symptoms, patient agrees with plan.      ____________________________________________   FINAL CLINICAL IMPRESSION(S) / ED DIAGNOSES  Final diagnoses:  Kidney stone  Right lower quadrant abdominal pain     ED Discharge Orders         Ordered    oxyCODONE-acetaminophen (PERCOCET) 5-325 MG tablet  Every 4 hours PRN        03/18/20 2042    ondansetron (ZOFRAN ODT) 4 MG disintegrating tablet  Every 8 hours PRN        03/18/20 2042           Note:  This document was prepared using Dragon voice recognition software and may include unintentional dictation errors.   Chesley Noon, MD 03/18/20 2050

## 2020-03-19 NOTE — Telephone Encounter (Signed)
Lvm to make this apt. 

## 2020-03-22 NOTE — Telephone Encounter (Signed)
Noted  

## 2020-03-22 NOTE — Telephone Encounter (Signed)
Pt stated he went to [facility] care then ER stated he passed a kidney stone and did not feel like he needed [place] at this time  But would call to make apts. Pt declined F.u at this time.

## 2020-04-13 ENCOUNTER — Other Ambulatory Visit: Payer: Self-pay | Admitting: Family Medicine

## 2020-04-13 DIAGNOSIS — E782 Mixed hyperlipidemia: Secondary | ICD-10-CM

## 2020-04-13 DIAGNOSIS — E7800 Pure hypercholesterolemia, unspecified: Secondary | ICD-10-CM

## 2020-04-13 NOTE — Telephone Encounter (Signed)
Patient needs to make appointment. Courtesy refill. Requested Prescriptions  Pending Prescriptions Disp Refills  . atorvastatin (LIPITOR) 10 MG tablet [Pharmacy Med Name: ATORVASTATIN 10 MG TABLET] 30 tablet 0    Sig: TAKE 1 TABLET BY MOUTH EVERY DAY     Cardiovascular:  Antilipid - Statins Failed - 04/13/2020  1:19 AM      Failed - LDL in normal range and within 360 days    LDL Chol Calc (NIH)  Date Value Ref Range Status  08/18/2019 115 (H) 0 - 99 mg/dL Final         Passed - Total Cholesterol in normal range and within 360 days    Cholesterol, Total  Date Value Ref Range Status  08/18/2019 182 100 - 199 mg/dL Final         Passed - HDL in normal range and within 360 days    HDL  Date Value Ref Range Status  08/18/2019 47 >39 mg/dL Final         Passed - Triglycerides in normal range and within 360 days    Triglycerides  Date Value Ref Range Status  08/18/2019 111 0 - 149 mg/dL Final         Passed - Patient is not pregnant      Passed - Valid encounter within last 12 months    Recent Outpatient Visits          7 months ago Essential hypertension   [facility] Particia Nearing, [place]   1 year ago Mixed hyperlipidemia   [facility] [place], Salley Hews, [place]   1 year ago Essential hypertension   [facility] Particia Nearing, [place]   1 year ago Pure hypercholesterolemia   [facility] Maurice March, [place], [place]   1 year ago Neuropathy involving both lower extremities   [facility] Crissman, Mark A, MD             . amLODipine (NORVASC) 5 MG tablet [Pharmacy Med Name: AMLODIPINE BESYLATE 5 MG TAB] 30 tablet 0    Sig: TAKE 1 TABLET BY MOUTH EVERY DAY     Cardiovascular:  Calcium Channel Blockers Failed - 04/13/2020  1:19 AM      Failed - Last BP in normal range    BP Readings from Last 1 Encounters:  03/18/20 (!) 146/93         Failed - Valid encounter within last 6 months     Recent Outpatient Visits          7 months ago Essential hypertension   [facility] Particia Nearing, [place]   1 year ago Mixed hyperlipidemia   [facility] [place], Salley Hews, [place]   1 year ago Essential hypertension   [facility] Particia Nearing, [place]   1 year ago Pure hypercholesterolemia   [facility] [place], [place], [place]   1 year ago Neuropathy involving both lower extremities   [facility] Crissman, Redge Gainer, [place]

## 2020-05-09 ENCOUNTER — Other Ambulatory Visit: Payer: Self-pay | Admitting: Nurse Practitioner

## 2020-05-09 DIAGNOSIS — E782 Mixed hyperlipidemia: Secondary | ICD-10-CM

## 2020-05-09 DIAGNOSIS — E7800 Pure hypercholesterolemia, unspecified: Secondary | ICD-10-CM

## 2020-05-09 NOTE — Telephone Encounter (Signed)
Requested medication (s) are due for refill today: yes  Requested medication (s) are on the active medication list: yes  Last refill:  amlodipine: 04/13/20    atorvastatin: 04/13/20  Future visit scheduled: no  Notes to clinic:  called pt and LM on VM to call office to schedule appt- phone number to office provided   Requested Prescriptions  Pending Prescriptions Disp Refills   amLODipine (NORVASC) 5 MG tablet [Pharmacy Med Name: AMLODIPINE BESYLATE 5 MG TAB] 30 tablet 0    Sig: TAKE 1 TABLET BY MOUTH EVERY DAY      Cardiovascular:  Calcium Channel Blockers Failed - 05/09/2020  9:04 AM      Failed - Last BP in normal range    BP Readings from Last 1 Encounters:  03/18/20 (!) 146/93          Failed - Valid encounter within last 6 months    Recent Outpatient Visits           8 months ago Essential hypertension   [facility] Particia Nearing, [place]   1 year ago Mixed hyperlipidemia   [facility] [place], Salley Hews, [place]   1 year ago Essential hypertension   [facility] Roosvelt Maser [place], [place]   1 year ago Pure hypercholesterolemia   [facility] Maurice March, [place], [place]   1 year ago Neuropathy involving both lower extremities   [facility] Crissman, Redge Gainer, MD                  atorvastatin (LIPITOR) 10 MG tablet [Pharmacy Med Name: ATORVASTATIN 10 MG TABLET] 30 tablet 0    Sig: TAKE 1 TABLET BY MOUTH EVERY DAY      Cardiovascular:  Antilipid - Statins Failed - 05/09/2020  9:04 AM      Failed - LDL in normal range and within 360 days    LDL Chol Calc (NIH)  Date Value Ref Range Status  08/18/2019 115 (H) 0 - 99 mg/dL Final          Passed - Total Cholesterol in normal range and within 360 days    Cholesterol, Total  Date Value Ref Range Status  08/18/2019 182 100 - 199 mg/dL Final          Passed - HDL in normal range and within 360 days    HDL  Date Value Ref Range Status   08/18/2019 47 >39 mg/dL Final          Passed - Triglycerides in normal range and within 360 days    Triglycerides  Date Value Ref Range Status  08/18/2019 111 0 - 149 mg/dL Final          Passed - Patient is not pregnant      Passed - Valid encounter within last 12 months    Recent Outpatient Visits           8 months ago Essential hypertension   [facility] Particia Nearing, [place]   1 year ago Mixed hyperlipidemia   [facility] [place], Salley Hews, [place]   1 year ago Essential hypertension   [facility] Particia Nearing, [place]   1 year ago Pure hypercholesterolemia   [facility] [place], [address], [place]   1 year ago Neuropathy involving both lower extremities   [facility] Crissman, Redge Gainer, MD

## 2020-05-10 NOTE — Telephone Encounter (Signed)
Scheduled 3/31

## 2020-05-10 NOTE — Telephone Encounter (Signed)
Called pt to schedule no answer left vm  

## 2020-05-25 ENCOUNTER — Ambulatory Visit: Payer: Medicare HMO | Admitting: Nurse Practitioner

## 2020-05-26 NOTE — Progress Notes (Signed)
BP 124/76   Pulse 76   Temp 98.3 F (36.8 C)   Wt 171 lb 8 oz (77.8 kg)   SpO2 97%   BMI 24.61 kg/m    Subjective:    Patient ID: Dennis Mclaughlin, male    DOB: 1950/04/20, 70 y.o.   MRN: [id number]  HPI: Dennis Mclaughlin is a 70 y.o. male  Chief Complaint  Patient presents with  . Hyperlipidemia    Would like 90 day supply, insurance covers 90  . Hypertension    Would like 90 day supply, insurance covers 90   HYPERTENSION / HYPERLIPIDEMIA Satisfied with current treatment? no Duration of hypertension: years BP monitoring frequency: not checking BP range:  BP medication side effects: no Past BP meds: amlodipine Duration of hyperlipidemia: years Cholesterol medication side effects: no Cholesterol supplements: fish oil Past cholesterol medications: atorvastain (lipitor) Medication compliance: excellent compliance Aspirin: yes Recent stressors: no Recurrent headaches: no Visual changes: no Palpitations: no Dyspnea: no Chest pain: no Lower extremity edema: no Dizzy/lightheaded: no    Relevant past medical, surgical, family and social history reviewed and updated as indicated. Interim medical history since our last visit reviewed. Allergies and medications reviewed and updated.  Review of Systems  Eyes: Negative for visual disturbance.  Respiratory: Negative for chest tightness and shortness of breath.   Cardiovascular: Negative for chest pain, palpitations and leg swelling.  Neurological: Negative for dizziness, light-headedness and headaches.    Per HPI unless specifically indicated above     Objective:    BP 124/76   Pulse 76   Temp 98.3 F (36.8 C)   Wt 171 lb 8 oz (77.8 kg)   SpO2 97%   BMI 24.61 kg/m   Wt Readings from Last 3 Encounters:  05/27/20 171 lb 8 oz (77.8 kg)  03/18/20 180 lb (81.6 kg)  08/18/19 172 lb (78 kg)    Physical Exam Vitals and nursing note reviewed.  Constitutional:      General: He is not in acute distress.     Appearance: Normal appearance. He is not ill-appearing, toxic-appearing or diaphoretic.  HENT:     Head: Normocephalic.     Right Ear: External ear normal.     Left Ear: External ear normal.     Nose: Nose normal. No congestion or rhinorrhea.     Mouth/Throat:     Mouth: Mucous membranes are moist.  Eyes:     General:        Right eye: No discharge.        Left eye: No discharge.     Extraocular Movements: Extraocular movements intact.     Conjunctiva/sclera: Conjunctivae normal.     Pupils: Pupils are equal, round, and reactive to light.  Cardiovascular:     Rate and Rhythm: Normal rate and regular rhythm.     Heart sounds: No murmur heard.   Pulmonary:     Effort: Pulmonary effort is normal. No respiratory distress.     Breath sounds: Normal breath sounds. No wheezing, rhonchi or rales.  Abdominal:     General: Abdomen is flat. Bowel sounds are normal.  Musculoskeletal:     Cervical back: Normal range of motion and neck supple.  Skin:    General: Skin is warm and dry.     Capillary Refill: Capillary refill takes less than 2 seconds.  Neurological:     General: No focal deficit present.     Mental Status: He is alert and oriented to person, place,  and time.  Psychiatric:        Mood and Affect: Mood normal.        Behavior: Behavior normal.        Thought Content: Thought content normal.        Judgment: Judgment normal.     Results for orders placed or performed during the hospital encounter of 03/18/20  Lipase, blood  Result Value Ref Range   Lipase 33 11 - 51 U/L  Comprehensive metabolic panel  Result Value Ref Range   Sodium 132 (L) 135 - 145 mmol/L   Potassium 4.0 3.5 - 5.1 mmol/L   Chloride 98 98 - 111 mmol/L   CO2 21 (L) 22 - 32 mmol/L   Glucose, Bld 127 (H) 70 - 99 mg/dL   BUN 15 8 - 23 mg/dL   Creatinine, Ser 0.95 0.61 - 1.24 mg/dL   Calcium 9.4 8.9 - 10.3 mg/dL   Total Protein 7.5 6.5 - 8.1 g/dL   Albumin 4.7 3.5 - 5.0 g/dL   AST 32 15 - 41 U/L    ALT 40 0 - 44 U/L   Alkaline Phosphatase 76 38 - 126 U/L   Total Bilirubin 0.7 0.3 - 1.2 mg/dL   GFR, Estimated >60 >60 mL/min   Anion gap 13 5 - 15  CBC  Result Value Ref Range   WBC 16.6 (H) 4.0 - 10.5 K/uL   RBC 4.59 4.22 - 5.81 MIL/uL   Hemoglobin 14.7 13.0 - 17.0 g/dL   HCT 42.2 39.0 - 52.0 %   MCV 91.9 80.0 - 100.0 fL   MCH 32.0 26.0 - 34.0 pg   MCHC 34.8 30.0 - 36.0 g/dL   RDW 11.9 11.5 - 15.5 %   Platelets 220 150 - 400 K/uL   nRBC 0.0 0.0 - 0.2 %  Urinalysis, Complete w Microscopic Urine, Clean Catch  Result Value Ref Range   Color, Urine YELLOW (A) YELLOW   APPearance CLEAR (A) CLEAR   Specific Gravity, Urine 1.041 (H) 1.005 - 1.030   pH 5.0 5.0 - 8.0   Glucose, UA 50 (A) NEGATIVE mg/dL   Hgb urine dipstick MODERATE (A) NEGATIVE   Bilirubin Urine NEGATIVE NEGATIVE   Ketones, ur 5 (A) NEGATIVE mg/dL   Protein, ur NEGATIVE NEGATIVE mg/dL   Nitrite NEGATIVE NEGATIVE   Leukocytes,Ua NEGATIVE NEGATIVE   RBC / HPF >50 (H) 0 - 5 RBC/hpf   WBC, UA 0-5 0 - 5 WBC/hpf   Bacteria, UA NONE SEEN NONE SEEN   Squamous Epithelial / LPF NONE SEEN 0 - 5   Mucus PRESENT       Assessment & Plan:   Problem List Items Addressed This Visit      Cardiovascular and Mediastinum   Essential hypertension - Primary    Chronic. Well controlled. Continue current regimen.  Labs ordered today.  Refills sent.       Relevant Medications   amLODipine (NORVASC) 5 MG tablet   atorvastatin (LIPITOR) 10 MG tablet   Other Relevant Orders   Comp Met (CMET)     Genitourinary   BPH with obstruction/lower urinary tract symptoms    PSA checked today.  Will make further recommendations based on lab results.         Other   Hyperlipidemia    Stable, recheck lipids and adjust if needed. Continue current regimen.  Refills sent today.        Relevant Medications   amLODipine (NORVASC) 5 MG tablet  atorvastatin (LIPITOR) 10 MG tablet   Other Relevant Orders   Lipid Profile    Other Visit  Diagnoses    Screening for prostate cancer       Will make recommendations based on lab results.    Relevant Orders   PSA       Follow up plan: Return in about 6 months (around 11/26/2020) for Physical and Fasting labs.

## 2020-05-27 ENCOUNTER — Encounter: Payer: Self-pay | Admitting: Nurse Practitioner

## 2020-05-27 ENCOUNTER — Ambulatory Visit (INDEPENDENT_AMBULATORY_CARE_PROVIDER_SITE_OTHER): Payer: Medicare HMO | Admitting: Nurse Practitioner

## 2020-05-27 ENCOUNTER — Other Ambulatory Visit: Payer: Self-pay

## 2020-05-27 VITALS — BP 124/76 | HR 76 | Temp 98.3°F | Wt 171.5 lb

## 2020-05-27 DIAGNOSIS — F419 Anxiety disorder, unspecified: Secondary | ICD-10-CM

## 2020-05-27 DIAGNOSIS — Z125 Encounter for screening for malignant neoplasm of prostate: Secondary | ICD-10-CM | POA: Diagnosis not present

## 2020-05-27 DIAGNOSIS — N401 Enlarged prostate with lower urinary tract symptoms: Secondary | ICD-10-CM | POA: Diagnosis not present

## 2020-05-27 DIAGNOSIS — I10 Essential (primary) hypertension: Secondary | ICD-10-CM | POA: Diagnosis not present

## 2020-05-27 DIAGNOSIS — E7800 Pure hypercholesterolemia, unspecified: Secondary | ICD-10-CM

## 2020-05-27 DIAGNOSIS — N138 Other obstructive and reflux uropathy: Secondary | ICD-10-CM

## 2020-05-27 DIAGNOSIS — E782 Mixed hyperlipidemia: Secondary | ICD-10-CM

## 2020-05-27 MED ORDER — ATORVASTATIN CALCIUM 10 MG PO TABS: 10.0000 mg | ORAL_TABLET | Freq: Every day | ORAL | 1 refills | Status: DC

## 2020-05-27 MED ORDER — AMLODIPINE BESYLATE 5 MG PO TABS
5.0000 mg | ORAL_TABLET | Freq: Every day | ORAL | 1 refills | Status: DC
Start: 2020-05-27 — End: 2020-11-30

## 2020-05-27 NOTE — Assessment & Plan Note (Signed)
PSA checked today.  Will make further recommendations based on lab results.

## 2020-05-27 NOTE — Assessment & Plan Note (Signed)
Stable, recheck lipids and adjust if needed. Continue current regimen.  Refills sent today.

## 2020-05-27 NOTE — Assessment & Plan Note (Signed)
Chronic. Well controlled. Continue current regimen.  Labs ordered today.  Refills sent.

## 2020-05-28 LAB — COMPREHENSIVE METABOLIC PANEL
ALT: 25 IU/L (ref 0–44)
AST: 24 IU/L (ref 0–40)
Albumin/Globulin Ratio: 2.1 (ref 1.2–2.2)
Albumin: 4.6 g/dL (ref 3.8–4.8)
Alkaline Phosphatase: 102 IU/L (ref 44–121)
BUN/Creatinine Ratio: 13 (ref 10–24)
BUN: 12 mg/dL (ref 8–27)
Bilirubin Total: 0.5 mg/dL (ref 0.0–1.2)
CO2: 21 mmol/L (ref 20–29)
Calcium: 9.7 mg/dL (ref 8.6–10.2)
Chloride: 102 mmol/L (ref 96–106)
Creatinine, Ser: 0.96 mg/dL (ref 0.76–1.27)
Globulin, Total: 2.2 g/dL (ref 1.5–4.5)
Glucose: 101 mg/dL — ABNORMAL HIGH (ref 65–99)
Potassium: 5 mmol/L (ref 3.5–5.2)
Sodium: 140 mmol/L (ref 134–144)
Total Protein: 6.8 g/dL (ref 6.0–8.5)
eGFR: 87 mL/min/{1.73_m2} (ref >59)

## 2020-05-28 LAB — PSA: Prostate Specific Ag, Serum: 1.2 ng/mL (ref 0.0–4.0)

## 2020-05-28 LAB — LIPID PANEL
Chol/HDL Ratio: 4.4 ratio (ref 0.0–5.0)
Cholesterol, Total: 198 mg/dL (ref 100–199)
HDL: 45 mg/dL (ref >39)
LDL Chol Calc (NIH): 131 mg/dL — ABNORMAL HIGH (ref 0–99)
Triglycerides: 124 mg/dL (ref 0–149)
VLDL Cholesterol Cal: 22 mg/dL (ref 5–40)

## 2020-05-28 MED ORDER — ATORVASTATIN CALCIUM 20 MG PO TABS: 20.0000 mg | ORAL_TABLET | Freq: Every day | ORAL | 1 refills | Status: DC

## 2020-05-28 NOTE — Progress Notes (Signed)
Medication sent to the pharmacy.

## 2020-05-28 NOTE — Progress Notes (Signed)
Please let patient know that his PSA was good at 1.2.  Liver function, kidney function and electrolytes look good. Your cholesterol is still high, but continued recommendations to make lifestyle changes. Your LDL is above normal. The LDL is the bad cholesterol. Over time and in combination with inflammation and other factors, this contributes to plaque which in turn may lead to stroke and/or heart attack down the road. Sometimes high LDL is primarily genetic, and people might be eating all the right foods but still have high numbers. Other times, there is room for improvement in one's diet and eating healthier can bring this number down and potentially reduce one's risk of heart attack and/or stroke.   To reduce your LDL, Remember - more fruits and vegetables, more fish, and limit red meat and dairy products. More soy, nuts, beans, barley, lentils, oats and plant sterol ester enriched margarine instead of butter. I also encourage eliminating sugar and processed food. Remember, shop on the outside of the grocery store and visit your [employer]. If you would like to talk with me about dietary changes plus or increasing his atorvastatin to 20mg  and increasing to a goal of 80mg  if he continues to tolerate the medication.  If patient agrees to the increase, I will send this into the pharmacy for him.  We should recheck your cholesterol in 3-6 months.

## 2020-05-28 NOTE — Addendum Note (Signed)
Addended by: Larae Grooms on: 05/28/2020 02:40 PM   Modules accepted: Orders

## 2020-07-05 DIAGNOSIS — H524 Presbyopia: Secondary | ICD-10-CM | POA: Diagnosis not present

## 2020-09-24 ENCOUNTER — Other Ambulatory Visit: Payer: Self-pay

## 2020-09-24 ENCOUNTER — Encounter: Payer: Self-pay | Admitting: Nurse Practitioner

## 2020-09-24 ENCOUNTER — Ambulatory Visit (INDEPENDENT_AMBULATORY_CARE_PROVIDER_SITE_OTHER): Payer: Medicare HMO | Admitting: Nurse Practitioner

## 2020-09-24 VITALS — Temp 99.6°F

## 2020-09-24 DIAGNOSIS — J069 Acute upper respiratory infection, unspecified: Secondary | ICD-10-CM | POA: Diagnosis not present

## 2020-09-24 NOTE — Progress Notes (Signed)
Acute Office Visit  Subjective:    Patient ID: Dennis Mclaughlin, male    DOB: 1950-10-31, 70 y.o.   MRN: [id number]  Chief Complaint  Patient presents with   Sore Throat    Patient states he has a ST, feels hoarse    Cough    Patient states he has a slight cough and low grade fever     HPI Patient is in today for sore throat, cough, and low grade fever that started yesterday.  UPPER RESPIRATORY TRACT INFECTION  Worst symptom: sore throat Fever: yes, low grade Cough: yes Shortness of breath: no Wheezing: no Chest pain: no Chest tightness: no Chest congestion: no Nasal congestion: yes Runny nose: yes Post nasal drip: yes Sneezing: no Sore throat: yes Swollen glands: no Sinus pressure: no Headache: no Face pain: no Toothache: no Ear pain: no  Ear pressure: no  Eyes red/itching:no Eye drainage/crusting: no  Vomiting: no Rash: no Fatigue: yes Sick contacts: no Strep contacts: no  Context: worse Recurrent sinusitis: no Relief with OTC cold/cough medications: no  Treatments attempted: mucinex, coricidin, advil    Past Medical History:  Diagnosis Date   History of prostatitis    Hyperlipidemia    Low testosterone     Past Surgical History:  Procedure Laterality Date   BICEPT TENODESIS Right 04/12/2015   Procedure: OPEN BICEPS TENODESIS;  Surgeon: Tania Ade, MD;  Location: [place];  Service: Orthopedics;  Laterality: Right;   COLONOSCOPY W/ BIOPSIES     SHOULDER ARTHROSCOPY WITH DISTAL CLAVICLE RESECTION Right 04/12/2015   Procedure: SHOULDER ARTHROSCOPY WITH DISTAL CLAVICLE RESECTION;  Surgeon: Tania Ade, MD;  Location: [place];  Service: Orthopedics;  Laterality: Right;   SHOULDER ARTHROSCOPY WITH SUBACROMIAL DECOMPRESSION Right 04/12/2015   Procedure: SHOULDER ARTHROSCOPY WITH SUBACROMIAL DECOMPRESSION DEBRIDEMENT LABRAL TEAR;  Surgeon: Tania Ade, MD;  Location: [place];  Service:  Orthopedics;  Laterality: Right;    Family History  Problem Relation Age of Onset   Hypertension Mother    Diabetes Maternal Aunt    Hearing loss Paternal Uncle    Stroke Paternal Uncle    Cancer Paternal Grandfather        prostate   COPD Neg Hx     Social History   Socioeconomic History   Marital status: Married    Spouse name: Not on file   Number of children: Not on file   Years of education: Not on file   Highest education level: Not on file  Occupational History   Not on file  Tobacco Use   Smoking status: Former    Types: Cigarettes    Quit date: 01/11/1990    Years since quitting: 30.7   Smokeless tobacco: Never  Vaping Use   Vaping Use: Never used  Substance and Sexual Activity   Alcohol use: Yes    Comment: socially   Drug use: No   Sexual activity: Not on file  Other Topics Concern   Not on file  Social History Narrative   Not on file   Social Determinants of Health   Financial Resource Strain: Not on file  Food Insecurity: Not on file  Transportation Needs: Not on file  Physical Activity: Not on file  Stress: Not on file  Social Connections: Not on file  Intimate Partner Violence: Not on file    Outpatient Medications Prior to Visit  Medication Sig Dispense Refill   ALPRAZolam (XANAX) 0.25 MG tablet Take 1 tablet (0.25  mg total) by mouth daily as needed for anxiety. 20 tablet 0   amLODipine (NORVASC) 5 MG tablet Take 1 tablet (5 mg total) by mouth daily. 90 tablet 1   Ascorbic Acid (VITAMIN C PO) Take by mouth daily.     aspirin EC 81 MG tablet Take 81 mg by mouth daily.     atorvastatin (LIPITOR) 20 MG tablet Take 1 tablet (20 mg total) by mouth daily. 90 tablet 1   Chlorpheniramine Maleate (ALLERGY PO) Take by mouth 2 (two) times daily.     Multiple Vitamins-Minerals (CENTRUM SILVER PO) Take by mouth daily.     Omega-3 Fatty Acids (FISH OIL PO) Take 1,200 mg by mouth 2 (two) times daily.     Oxymetazoline HCl (AFRIN NASAL SPRAY NA) Place  into the nose as needed.     VITAMIN D PO Take by mouth daily. Vitamin D3     No facility-administered medications prior to visit.    No Known Allergies  Review of Systems  Constitutional:  Positive for chills, fatigue and fever.  HENT:  Positive for congestion, postnasal drip, rhinorrhea and sore throat. Negative for ear pain and sinus pressure.   Eyes: Negative.   Respiratory:  Positive for cough.   Cardiovascular: Negative.   Gastrointestinal: Negative.   Genitourinary: Negative.   Skin: Negative.   Neurological: Negative.       Objective:    Physical Exam Vitals and nursing note reviewed.  Pulmonary:     Comments: Able to talk in complete sentences Neurological:     Mental Status: He is oriented to person, place, and time.  Psychiatric:        Thought Content: Thought content normal.    Temp 99.6 F (37.6 C)  Wt Readings from Last 3 Encounters:  05/27/20 171 lb 8 oz (77.8 kg)  03/18/20 180 lb (81.6 kg)  08/18/19 172 lb (78 kg)    Health Maintenance Due  Topic Date Due   Zoster Vaccines- Shingrix (1 of 2) Never done   COVID-19 Vaccine (3 - Booster for Pfizer series) 01/15/2020    There are no preventive care reminders to display for this patient.   Lab Results  Component Value Date   TSH 3.180 02/07/2018   Lab Results  Component Value Date   WBC 16.6 (H) 03/18/2020   HGB 14.7 03/18/2020   HCT 42.2 03/18/2020   MCV 91.9 03/18/2020   PLT 220 03/18/2020   Lab Results  Component Value Date   NA 140 05/27/2020   K 5.0 05/27/2020   CO2 21 05/27/2020   GLUCOSE 101 (H) 05/27/2020   BUN 12 05/27/2020   CREATININE 0.96 05/27/2020   BILITOT 0.5 05/27/2020   ALKPHOS 102 05/27/2020   AST 24 05/27/2020   ALT 25 05/27/2020   PROT 6.8 05/27/2020   ALBUMIN 4.6 05/27/2020   CALCIUM 9.7 05/27/2020   ANIONGAP 13 03/18/2020   EGFR 87 05/27/2020   Lab Results  Component Value Date   CHOL 198 05/27/2020   Lab Results  Component Value Date   HDL 45  05/27/2020   Lab Results  Component Value Date   LDLCALC 131 (H) 05/27/2020   Lab Results  Component Value Date   TRIG 124 05/27/2020   Lab Results  Component Value Date   CHOLHDL 4.4 05/27/2020   No results found for: HGBA1C     Assessment & Plan:   Problem List Items Addressed This Visit   None    No orders of  the defined types were placed in this encounter.   This visit was completed via telephone due to the restrictions of the COVID-19 pandemic. All issues as above were discussed and addressed but no physical exam was performed. If it was felt that the patient should be evaluated in the office, they were directed there. The patient verbally consented to this visit. Patient was unable to complete an audio/visual visit due to Lack of equipment. Due to the catastrophic nature of the COVID-19 pandemic, this visit was done through audio contact only. Location of the patient: home Location of the provider: work Those involved with this call:  Provider: Vance Peper, DNP CMA: Louanna Raw, CMA Front Desk/Registration: Jill Side  Time spent on call:  10 minutes on the phone discussing health concerns. 10 minutes total spent in review of patient's record and preparation of their chart.   Charyl Dancer, NP

## 2020-09-25 LAB — SARS-COV-2, NAA 2 DAY TAT

## 2020-09-25 LAB — NOVEL CORONAVIRUS, NAA: SARS-CoV-2, NAA: DETECTED — AB

## 2020-09-25 MED ORDER — MOLNUPIRAVIR EUA 200MG CAPSULE: 4.0000 | ORAL_CAPSULE | Freq: Two times a day (BID) | ORAL | 0 refills | Status: AC

## 2020-09-25 NOTE — Addendum Note (Signed)
Addended by: Rodman Pickle A on: 09/25/2020 02:32 PM   Modules accepted: Orders

## 2020-11-16 ENCOUNTER — Telehealth: Payer: Self-pay | Admitting: Nurse Practitioner

## 2020-11-16 ENCOUNTER — Other Ambulatory Visit: Payer: Self-pay | Admitting: Nurse Practitioner

## 2020-11-16 NOTE — Telephone Encounter (Signed)
Copied from CRM [id number]. Topic: Medicare AWV >> Nov 16, 2020  4:23 PM Leigh Aurora wrote: Reason for CRM:  Left message for patient to call back and schedule Medicare Annual Wellness Visit (AWV) to be done virtually or by telephone.  No hx of AWV eligible as of 03/30/20  Please schedule at anytime with [facility]-Nurse Health Advisor.      45 Minutes appointment   Any questions, please call me at [phone]

## 2020-11-25 ENCOUNTER — Telehealth: Payer: Self-pay

## 2020-11-25 NOTE — Telephone Encounter (Signed)
Copied from CRM [id number]. Topic: Appointment Scheduling - Scheduling Inquiry for Clinic >> Nov 24, 2020  2:42 PM Randol Kern wrote: Reason for CRM: Pt needs to reschedule his AWV, he needs MWF in the AM Best contact: [phone] >> Nov 24, 2020  2:44 PM Randol Kern wrote: *9:30 am or after, on Monday Wednesday or Thursday    Called and LVM asking for patient to please return my call to reschedule AWV.

## 2020-11-25 NOTE — Telephone Encounter (Signed)
Called patient, AWV scheduled for Monday 11/29/20

## 2020-11-25 NOTE — Telephone Encounter (Signed)
Pt returned call. Checked schedule, no availability until next year. Please advise

## 2020-11-25 NOTE — Telephone Encounter (Signed)
Patient called back to schedule appointment with [place] for AWV can be reached at Ph#  [phone]

## 2020-11-29 ENCOUNTER — Ambulatory Visit (INDEPENDENT_AMBULATORY_CARE_PROVIDER_SITE_OTHER): Payer: Medicare HMO

## 2020-11-29 ENCOUNTER — Ambulatory Visit: Payer: Medicare HMO

## 2020-11-29 VITALS — Ht 70.0 in | Wt 165.0 lb

## 2020-11-29 DIAGNOSIS — Z0000 Encounter for general adult medical examination without abnormal findings: Secondary | ICD-10-CM | POA: Diagnosis not present

## 2020-11-29 NOTE — Progress Notes (Signed)
I connected with Dennis Mclaughlin today by telephone and verified that I am speaking with the correct person using two identifiers. Location patient: home Location provider: work Persons participating in the virtual visit: Peyten, Weare LPN.   I discussed the limitations, risks, security and privacy concerns of performing an evaluation and management service by telephone and the availability of in person appointments. I also discussed with the patient that there may be a patient responsible charge related to this service. The patient expressed understanding and verbally consented to this telephonic visit.    Interactive audio and video telecommunications were attempted between this provider and patient, however failed, due to patient having technical difficulties OR patient did not have access to video capability.  We continued and completed visit with audio only.     Vital signs may be patient reported or missing.  Subjective:   Dennis Mclaughlin is a 70 y.o. male who presents for Medicare Annual/Subsequent preventive examination.  Review of Systems     Cardiac Risk Factors include: advanced age (>38men, >32 women);hypertension;male gender     Objective:    Today's Vitals   11/29/20 0941  Weight: 165 lb (74.8 kg)  Height: 5\' 10"  (1.778 m)   Body mass index is 23.68 kg/m.  Advanced Directives 11/29/2020 03/18/2020 04/07/2015  Does Patient Have a Medical Advance Directive? No No No    Current Medications (verified) Outpatient Encounter Medications as of 11/29/2020  Medication Sig   ALPRAZolam (XANAX) 0.25 MG tablet Take 1 tablet (0.25 mg total) by mouth daily as needed for anxiety.   amLODipine (NORVASC) 5 MG tablet Take 1 tablet (5 mg total) by mouth daily.   Ascorbic Acid (VITAMIN C PO) Take by mouth daily.   aspirin EC 81 MG tablet Take 81 mg by mouth daily.   atorvastatin (LIPITOR) 20 MG tablet TAKE 1 TABLET BY MOUTH EVERY DAY   Chlorpheniramine Maleate (ALLERGY PO)  Take by mouth 2 (two) times daily.   Multiple Vitamins-Minerals (CENTRUM SILVER PO) Take by mouth daily.   Omega-3 Fatty Acids (FISH OIL PO) Take 1,200 mg by mouth 2 (two) times daily.   Oxymetazoline HCl (AFRIN NASAL SPRAY NA) Place into the nose as needed.   VITAMIN D PO Take by mouth daily. Vitamin D3   No facility-administered encounter medications on file as of 11/29/2020.    Allergies (verified) Patient has no known allergies.   History: Past Medical History:  Diagnosis Date   History of prostatitis    Hyperlipidemia    Low testosterone    Past Surgical History:  Procedure Laterality Date   BICEPT TENODESIS Right 04/12/2015   Procedure: OPEN BICEPS TENODESIS;  Surgeon: 04/14/2015, MD;  Location: [facility];  Service: Orthopedics;  Laterality: Right;   COLONOSCOPY W/ BIOPSIES     SHOULDER ARTHROSCOPY WITH DISTAL CLAVICLE RESECTION Right 04/12/2015   Procedure: SHOULDER ARTHROSCOPY WITH DISTAL CLAVICLE RESECTION;  Surgeon: 04/14/2015, MD;  Location: [facility];  Service: Orthopedics;  Laterality: Right;   SHOULDER ARTHROSCOPY WITH SUBACROMIAL DECOMPRESSION Right 04/12/2015   Procedure: SHOULDER ARTHROSCOPY WITH SUBACROMIAL DECOMPRESSION DEBRIDEMENT LABRAL TEAR;  Surgeon: 04/14/2015, MD;  Location: [facility];  Service: Orthopedics;  Laterality: Right;   Family History  Problem Relation Age of Onset   Hypertension Mother    Diabetes Maternal Aunt    Hearing loss Paternal Uncle    Stroke Paternal Uncle    Cancer Paternal Grandfather  prostate   COPD Neg Hx    Social History   Socioeconomic History   Marital status: Married    Spouse name: Not on file   Number of children: Not on file   Years of education: Not on file   Highest education level: Not on file  Occupational History   Not on file  Tobacco Use   Smoking status: Former    Types: Cigarettes    Quit date: 01/11/1990    Years since quitting:  30.9   Smokeless tobacco: Never  Vaping Use   Vaping Use: Never used  Substance and Sexual Activity   Alcohol use: Yes    Comment: socially   Drug use: No   Sexual activity: Not on file  Other Topics Concern   Not on file  Social History Narrative   Not on file   Social Determinants of Health   Financial Resource Strain: Low Risk    Difficulty of Paying Living Expenses: Not hard at all  Food Insecurity: No Food Insecurity   Worried About Programme researcher, broadcasting/film/video in the Last Year: Never true   Ran Out of Food in the Last Year: Never true  Transportation Needs: No Transportation Needs   Lack of Transportation (Medical): No   Lack of Transportation (Non-Medical): No  Physical Activity: Inactive   Days of Exercise per Week: 0 days   Minutes of Exercise per Session: 0 min  Stress: Stress Concern Present   Feeling of Stress : To some extent  Social Connections: Not on file    Tobacco Counseling Counseling given: Not Answered   Clinical Intake:  Pre-visit preparation completed: Yes  Pain : No/denies pain     Nutritional Risks: None Diabetes: No  How often do you need to have someone help you when you read instructions, pamphlets, or other written materials from your doctor or pharmacy?: 1 - Never What is the last grade level you completed in school?: 12th grade  Diabetic? no  Interpreter Needed?: No  Information entered by :: NAllen LPN   Activities of Daily Living In your present state of health, do you have any difficulty performing the following activities: 11/29/2020 05/27/2020  Hearing? N N  Vision? N N  Difficulty concentrating or making decisions? N N  Walking or climbing stairs? N N  Dressing or bathing? N N  Doing errands, shopping? N N  Preparing Food and eating ? N -  Using the Toilet? N -  In the past six months, have you accidently leaked urine? N -  Do you have problems with loss of bowel control? N -  Managing your Medications? N -  Managing your  Finances? N -  Housekeeping or managing your Housekeeping? N -  Some recent data might be hidden    Patient Care Team: Larae Grooms, NP as PCP - General  Indicate any recent Medical Services you may have received from other than [facility] providers in the past year (date may be approximate).     Assessment:   This is a routine wellness examination for Dennis Mclaughlin.  Hearing/Vision screen Vision Screening - Comments:: Regular eye exams,  Dietary issues and exercise activities discussed: Current Exercise Habits: The patient does not participate in regular exercise at present   Goals Addressed             This Visit's Progress    Patient Stated       11/29/2020, no goals       Depression Screen PHQ 2/9  Scores 11/29/2020 05/27/2020 02/17/2019 12/25/2018 08/02/2017 01/10/2017 01/10/2016  PHQ - 2 Score 0 0 0 0 0 0 0  PHQ- 9 Score - - 0 0 - - -    Fall Risk Fall Risk  11/29/2020 05/27/2020 02/17/2019 12/25/2018 08/02/2017  Falls in the past year? 0 0 0 0 No  Number falls in past yr: - 0 0 0 -  Injury with Fall? - 0 0 0 -  Risk for fall due to : Medication side effect - - - -  Follow up Falls evaluation completed;Education provided;Falls prevention discussed - - - -    FALL RISK PREVENTION PERTAINING TO THE HOME:  Any stairs in or around the home? Yes  If so, are there any without handrails? No  Home free of loose throw rugs in walkways, pet beds, electrical cords, etc? Yes  Adequate lighting in your home to reduce risk of falls? Yes   ASSISTIVE DEVICES UTILIZED TO PREVENT FALLS:  Life alert? No  Use of a cane, walker or w/c? No  Grab bars in the bathroom? Yes  Shower chair or bench in shower? Yes  Elevated toilet seat or a handicapped toilet? Yes   TIMED UP AND GO:  Was the test performed? No .       Cognitive Function:     6CIT Screen 11/29/2020  What Year? 0 points  What month? 0 points  What time? 0 points  Count back from 20 0 points  Months in reverse 2  points  Repeat phrase 4 points  Total Score 6    Immunizations Immunization History  Administered Date(s) Administered   Fluad Quad(high Dose 65+) 10/29/2018, 12/24/2019   Influenza, High Dose Seasonal PF 01/11/2018   Influenza-Unspecified 12/09/2014, 12/17/2015   PFIZER(Purple Top)SARS-COV-2 Vaccination 07/25/2019, 08/15/2019   Pneumococcal Conjugate-13 02/07/2018   Pneumococcal Polysaccharide-23 02/17/2019   Tdap 01/10/2017    TDAP status: Up to date  Flu Vaccine status: Due, Education has been provided regarding the importance of this vaccine. Advised may receive this vaccine at local pharmacy or Health Dept. Aware to provide a copy of the vaccination record if obtained from local pharmacy or Health Dept. Verbalized acceptance and understanding.  Pneumococcal vaccine status: Up to date  Covid-19 vaccine status: Completed vaccines  Qualifies for Shingles Vaccine? Yes   Zostavax completed No   Shingrix Completed?: No.    Education has been provided regarding the importance of this vaccine. Patient has been advised to call insurance company to determine out of pocket expense if they have not yet received this vaccine. Advised may also receive vaccine at local pharmacy or Health Dept. Verbalized acceptance and understanding.  Screening Tests Health Maintenance  Topic Date Due   Zoster Vaccines- Shingrix (1 of 2) Never done   COVID-19 Vaccine (3 - Booster for Pfizer series) 01/15/2020   INFLUENZA VACCINE  09/27/2020   COLONOSCOPY (Pts 45-71yrs Insurance coverage will need to be confirmed)  03/23/2024   TETANUS/TDAP  01/11/2027   Hepatitis C Screening  Completed   HPV VACCINES  Aged Out    Health Maintenance  Health Maintenance Due  Topic Date Due   Zoster Vaccines- Shingrix (1 of 2) Never done   COVID-19 Vaccine (3 - Booster for Pfizer series) 01/15/2020   INFLUENZA VACCINE  09/27/2020    Colorectal cancer screening: Type of screening: Colonoscopy. Completed 03/23/2014.  Repeat every 10 years  Lung Cancer Screening: (Low Dose CT Chest recommended if Age 49-80 years, 30 pack-year currently smoking OR have quit  w/in 15years.) does not qualify.   Lung Cancer Screening Referral: no  Additional Screening:  Hepatitis C Screening: does qualify; Completed 02/17/2020  Vision Screening: Recommended annual ophthalmology exams for early detection of glaucoma and other disorders of the eye. Is the patient up to date with their annual eye exam?  Yes  Who is the provider or what is the name of the office in which the patient attends annual eye exams? Dr. Alvester Morin If pt is not established with a provider, would they like to be referred to a provider to establish care? No .   Dental Screening: Recommended annual dental exams for proper oral hygiene  Community Resource Referral / Chronic Care Management: CRR required this visit?  No   CCM required this visit?  No      Plan:     I have personally reviewed and noted the following in the patient's chart:   Medical and social history Use of alcohol, tobacco or illicit drugs  Current medications and supplements including opioid prescriptions. Patient is not currently taking opioid prescriptions. Functional ability and status Nutritional status Physical activity Advanced directives List of other physicians Hospitalizations, surgeries, and ER visits in previous 12 months Vitals Screenings to include cognitive, depression, and falls Referrals and appointments  In addition, I have reviewed and discussed with patient certain preventive protocols, quality metrics, and best practice recommendations. A written personalized care plan for preventive services as well as general preventive health recommendations were provided to patient.     Barb Merino, LPN   [id number]   Nurse Notes:

## 2020-11-29 NOTE — Patient Instructions (Signed)
Dennis Mclaughlin , Thank you for taking time to come for your Medicare Wellness Visit. I appreciate your ongoing commitment to your health goals. Please review the following plan we discussed and let me know if I can assist you in the future.   Screening recommendations/referrals: Colonoscopy: completed 03/23/2014 Recommended yearly ophthalmology/optometry visit for glaucoma screening and checkup Recommended yearly dental visit for hygiene and checkup  Vaccinations: Influenza vaccine: due Pneumococcal vaccine: completed 02/17/2019 Tdap vaccine: completed 01/10/2017, due 01/11/2027 Shingles vaccine: discussed   Covid-19:  08/15/2019, 07/25/2019  Advanced directives: Advance directive discussed with you today.   Conditions/risks identified: none  Next appointment: Follow up in one year for your annual wellness visit.   Preventive Care 12 Years and Older, Male Preventive care refers to lifestyle choices and visits with your health care provider that can promote health and wellness. What does preventive care include? A yearly physical exam. This is also called an annual well check. Dental exams once or twice a year. Routine eye exams. Ask your health care provider how often you should have your eyes checked. Personal lifestyle choices, including: Daily care of your teeth and gums. Regular physical activity. Eating a healthy diet. Avoiding tobacco and drug use. Limiting alcohol use. Practicing safe sex. Taking low doses of aspirin every day. Taking vitamin and mineral supplements as recommended by your health care provider. What happens during an annual well check? The services and screenings done by your health care provider during your annual well check will depend on your age, overall health, lifestyle risk factors, and family history of disease. Counseling  Your health care provider may ask you questions about your: Alcohol use. Tobacco use. Drug use. Emotional well-being. Home and  relationship well-being. Sexual activity. Eating habits. History of falls. Memory and ability to understand (cognition). Work and work Astronomer. Screening  You may have the following tests or measurements: Height, weight, and BMI. Blood pressure. Lipid and cholesterol levels. These may be checked every 5 years, or more frequently if you are over 53 years old. Skin check. Lung cancer screening. You may have this screening every year starting at age 54 if you have a 30-pack-year history of smoking and currently smoke or have quit within the past 15 years. Fecal occult blood test (FOBT) of the stool. You may have this test every year starting at age 68. Flexible sigmoidoscopy or colonoscopy. You may have a sigmoidoscopy every 5 years or a colonoscopy every 10 years starting at age 46. Prostate cancer screening. Recommendations will vary depending on your family history and other risks. Hepatitis C blood test. Hepatitis B blood test. Sexually transmitted disease (STD) testing. Diabetes screening. This is done by checking your blood sugar (glucose) after you have not eaten for a while (fasting). You may have this done every 1-3 years. Abdominal aortic aneurysm (AAA) screening. You may need this if you are a current or former smoker. Osteoporosis. You may be screened starting at age 92 if you are at high risk. Talk with your health care provider about your test results, treatment options, and if necessary, the need for more tests. Vaccines  Your health care provider may recommend certain vaccines, such as: Influenza vaccine. This is recommended every year. Tetanus, diphtheria, and acellular pertussis (Tdap, Td) vaccine. You may need a Td booster every 10 years. Zoster vaccine. You may need this after age 86. Pneumococcal 13-valent conjugate (PCV13) vaccine. One dose is recommended after age 53. Pneumococcal polysaccharide (PPSV23) vaccine. One dose is recommended after age  64. Talk to your  health care provider about which screenings and vaccines you need and how often you need them. This information is not intended to replace advice given to you by your health care provider. Make sure you discuss any questions you have with your health care provider. Document Released: 03/12/2015 Document Revised: 11/03/2015 Document Reviewed: 12/15/2014 Elsevier Interactive Patient Education  2017 [place] Prevention in the Home Falls can cause injuries. They can happen to people of all ages. There are many things you can do to make your home safe and to help prevent falls. What can I do on the outside of my home? Regularly fix the edges of walkways and driveways and fix any cracks. Remove anything that might make you trip as you walk through a door, such as a raised step or threshold. Trim any bushes or trees on the path to your home. Use bright outdoor lighting. Clear any walking paths of anything that might make someone trip, such as rocks or tools. Regularly check to see if handrails are loose or broken. Make sure that both sides of any steps have handrails. Any raised decks and porches should have guardrails on the edges. Have any leaves, snow, or ice cleared regularly. Use sand or salt on walking paths during winter. Clean up any spills in your garage right away. This includes oil or grease spills. What can I do in the bathroom? Use night lights. Install grab bars by the toilet and in the tub and shower. Do not use towel bars as grab bars. Use non-skid mats or decals in the tub or shower. If you need to sit down in the shower, use a plastic, non-slip stool. Keep the floor dry. Clean up any water that spills on the floor as soon as it happens. Remove soap buildup in the tub or shower regularly. Attach bath mats securely with double-sided non-slip rug tape. Do not have throw rugs and other things on the floor that can make you trip. What can I do in the bedroom? Use night  lights. Make sure that you have a light by your bed that is easy to reach. Do not use any sheets or blankets that are too big for your bed. They should not hang down onto the floor. Have a firm chair that has side arms. You can use this for support while you get dressed. Do not have throw rugs and other things on the floor that can make you trip. What can I do in the kitchen? Clean up any spills right away. Avoid walking on wet floors. Keep items that you use a lot in easy-to-reach places. If you need to reach something above you, use a strong step stool that has a grab bar. Keep electrical cords out of the way. Do not use floor polish or wax that makes floors slippery. If you must use wax, use non-skid floor wax. Do not have throw rugs and other things on the floor that can make you trip. What can I do with my stairs? Do not leave any items on the stairs. Make sure that there are handrails on both sides of the stairs and use them. Fix handrails that are broken or loose. Make sure that handrails are as long as the stairways. Check any carpeting to make sure that it is firmly attached to the stairs. Fix any carpet that is loose or worn. Avoid having throw rugs at the top or bottom of the stairs. If you do have  throw rugs, attach them to the floor with carpet tape. Make sure that you have a light switch at the top of the stairs and the bottom of the stairs. If you do not have them, ask someone to add them for you. What else can I do to help prevent falls? Wear shoes that: Do not have high heels. Have rubber bottoms. Are comfortable and fit you well. Are closed at the toe. Do not wear sandals. If you use a stepladder: Make sure that it is fully opened. Do not climb a closed stepladder. Make sure that both sides of the stepladder are locked into place. Ask someone to hold it for you, if possible. Clearly mark and make sure that you can see: Any grab bars or handrails. First and last  steps. Where the edge of each step is. Use tools that help you move around (mobility aids) if they are needed. These include: Canes. Walkers. Scooters. Crutches. Turn on the lights when you go into a dark area. Replace any light bulbs as soon as they burn out. Set up your furniture so you have a clear path. Avoid moving your furniture around. If any of your floors are uneven, fix them. If there are any pets around you, be aware of where they are. Review your medicines with your doctor. Some medicines can make you feel dizzy. This can increase your chance of falling. Ask your doctor what other things that you can do to help prevent falls. This information is not intended to replace advice given to you by your health care provider. Make sure you discuss any questions you have with your health care provider. Document Released: 12/10/2008 Document Revised: 07/22/2015 Document Reviewed: 03/20/2014 Elsevier Interactive Patient Education  2017 [employer].

## 2020-11-30 ENCOUNTER — Other Ambulatory Visit: Payer: Self-pay | Admitting: Nurse Practitioner

## 2020-11-30 DIAGNOSIS — I10 Essential (primary) hypertension: Secondary | ICD-10-CM

## 2020-12-03 ENCOUNTER — Ambulatory Visit (INDEPENDENT_AMBULATORY_CARE_PROVIDER_SITE_OTHER): Payer: Medicare HMO

## 2020-12-03 ENCOUNTER — Other Ambulatory Visit: Payer: Self-pay

## 2020-12-03 DIAGNOSIS — Z23 Encounter for immunization: Secondary | ICD-10-CM

## 2020-12-29 ENCOUNTER — Encounter: Payer: Self-pay | Admitting: Urology

## 2020-12-29 ENCOUNTER — Other Ambulatory Visit: Payer: Self-pay

## 2020-12-29 ENCOUNTER — Ambulatory Visit: Payer: Medicare HMO | Admitting: Urology

## 2020-12-29 VITALS — BP 132/77 | HR 80 | Ht 70.0 in | Wt 166.8 lb

## 2020-12-29 DIAGNOSIS — N419 Inflammatory disease of prostate, unspecified: Secondary | ICD-10-CM

## 2020-12-29 DIAGNOSIS — N401 Enlarged prostate with lower urinary tract symptoms: Secondary | ICD-10-CM | POA: Diagnosis not present

## 2020-12-29 DIAGNOSIS — N200 Calculus of kidney: Secondary | ICD-10-CM | POA: Diagnosis not present

## 2020-12-29 LAB — MICROSCOPIC EXAMINATION

## 2020-12-29 LAB — URINALYSIS, COMPLETE
Bilirubin, UA: NEGATIVE
Ketones, UA: NEGATIVE
Leukocytes,UA: NEGATIVE
Nitrite, UA: NEGATIVE
Protein,UA: NEGATIVE
RBC, UA: NEGATIVE
Specific Gravity, UA: 1.02 (ref 1.005–1.030)
Urobilinogen, Ur: 0.2 mg/dL (ref 0.2–1.0)
pH, UA: 6.5 (ref 5.0–7.5)

## 2020-12-29 LAB — BLADDER SCAN AMB NON-IMAGING: Scan Result: 22

## 2020-12-29 MED ORDER — ALFUZOSIN HCL ER 10 MG PO TB24: 10.0000 mg | ORAL_TABLET | Freq: Every day | ORAL | 0 refills | Status: DC

## 2020-12-29 NOTE — Progress Notes (Signed)
12/29/2020 10:46 AM   Cecelia Byars 06-13-1950 [id number]  Referring provider: Larae Grooms, NP [address] [place],  [place] [address]  Chief Complaint  Patient presents with   Prostatitis    HPI: Harshith Pursell is a 70 y.o. male who presents to establish local urologic care.  Relates to a long history of prostate enlargement and prostatitis previously followed by Dr. Achilles Dunk Complains of intermittent symptoms of hesitancy, decreased stream and straining to urinate which is worse after ejaculation Has been on tamsulosin in the past however did not like the side effect of ejaculatory dysfunction and was also on a 5-ARI which she discontinued secondary to breast enlargement Denies dysuria, gross hematuria Denies flank, abdominal or pelvic pain Seen in ED January 2022 with renal colic secondary to a right ureteral calculus which she subsequently passed.  Also had bilateral nephrolithiasis PSA performed at PCP office 05/27/2020 was stable at 1.2 IPSS 19/35    PMH: Past Medical History:  Diagnosis Date   History of prostatitis    Hyperlipidemia    Low testosterone     Surgical History: Past Surgical History:  Procedure Laterality Date   BICEPT TENODESIS Right 04/12/2015   Procedure: OPEN BICEPS TENODESIS;  Surgeon: Jones Broom, MD;  Location: [facility];  Service: Orthopedics;  Laterality: Right;   COLONOSCOPY W/ BIOPSIES     SHOULDER ARTHROSCOPY WITH DISTAL CLAVICLE RESECTION Right 04/12/2015   Procedure: SHOULDER ARTHROSCOPY WITH DISTAL CLAVICLE RESECTION;  Surgeon: Jones Broom, MD;  Location: [facility];  Service: Orthopedics;  Laterality: Right;   SHOULDER ARTHROSCOPY WITH SUBACROMIAL DECOMPRESSION Right 04/12/2015   Procedure: SHOULDER ARTHROSCOPY WITH SUBACROMIAL DECOMPRESSION DEBRIDEMENT LABRAL TEAR;  Surgeon: Jones Broom, MD;  Location: [facility];  Service: Orthopedics;  Laterality: Right;    Home Medications:   Allergies as of 12/29/2020   No Known Allergies      Medication List        Accurate as of December 29, 2020 10:46 AM. If you have any questions, ask your nurse or doctor.          STOP taking these medications    AFRIN NASAL SPRAY NA Stopped by: [name], MD   ALLERGY PO Stopped by: [name], MD       TAKE these medications    ALPRAZolam 0.25 MG tablet Commonly known as: XANAX Take 1 tablet (0.25 mg total) by mouth daily as needed for anxiety.   amLODipine 5 MG tablet Commonly known as: NORVASC TAKE 1 TABLET (5 MG TOTAL) BY MOUTH DAILY.   aspirin EC 81 MG tablet Take 81 mg by mouth daily.   atorvastatin 20 MG tablet Commonly known as: LIPITOR TAKE 1 TABLET BY MOUTH EVERY DAY   CENTRUM SILVER PO Take by mouth daily.   FISH OIL PO Take 1,200 mg by mouth 2 (two) times daily.   VITAMIN C PO Take by mouth daily.   VITAMIN D PO Take by mouth daily. Vitamin D3        Allergies: No Known Allergies  Family History: Family History  Problem Relation Age of Onset   Hypertension Mother    Diabetes Maternal Aunt    Hearing loss Paternal Uncle    Stroke Paternal Uncle    Cancer Paternal Grandfather        prostate   COPD Neg Hx     Social History:  reports that he quit smoking about 30 years ago. His smoking use included  cigarettes. He has never used smokeless tobacco. He reports current alcohol use. He reports that he does not use drugs.   Physical Exam: BP 132/77   Pulse 80   Ht 5\' 10"  (1.778 m)   Wt 166 lb 12.8 oz (75.7 kg)   BMI 23.93 kg/m   Constitutional:  Alert and oriented, No acute distress. HEENT: [place] AT, moist mucus membranes.  Trachea midline, no masses. Cardiovascular: No clubbing, cyanosis, or edema. Respiratory: Normal respiratory effort, no increased work of breathing. GI: Abdomen is soft, nontender, nondistended, no abdominal masses GU: Prostate 45 g, smooth without nodules Psychiatric: Normal mood and  affect.  Laboratory Data:  Urinalysis 12/29/2020: Dipstick 3+ glucose/microscopy negative   Assessment & Plan:    1.  BPH with LUTS Moderate LUTS Bladder scan PVR 22 mL Ejaculatory dysfunction with tamsulosin; recommended trial alfuzosin 10 mg daily x30 days; if effective and he desires to continue he will call back for refills Annual follow-up   2. Prostatitis, unspecified prostatitis type Presently asymptomatic Urinalysis today unremarkable  3.  Bilateral nephrolithiasis KUB 1 year   Abbie Sons, MD  [facility] [address] [place], [place] [address] [phone]

## 2021-01-20 ENCOUNTER — Other Ambulatory Visit: Payer: Self-pay | Admitting: Urology

## 2021-01-24 ENCOUNTER — Telehealth: Payer: Self-pay

## 2021-01-24 NOTE — Telephone Encounter (Signed)
We could try a silodosin-it has the lowest side effect of dizziness but also has higher side effects than tamsulosin of ejaculation disorder.  He was switched from tamsulosin to alfuzosin as the ejaculatory problems or bothersome.  Can make a follow-up appointment to discuss UroLift or if he is interested in pursuing UroLift can schedule cystoscopy and TRUS prostate

## 2021-01-24 NOTE — Telephone Encounter (Signed)
Patient left a voice mail on the triage line stating that the Alfuzosin is causing dizziness. He has stopped the medication and would like to know if there is anything else he is able to try? Please advise

## 2021-01-26 NOTE — Telephone Encounter (Signed)
Patient left message on triage line returning your call

## 2021-01-26 NOTE — Telephone Encounter (Signed)
Left message for patient to call us back.  

## 2021-01-27 NOTE — Telephone Encounter (Signed)
Notified patient as instructed, patient pleased. Discussed follow-up appointments, patient agrees  Appt made cysto and trus

## 2021-02-04 ENCOUNTER — Other Ambulatory Visit: Payer: Self-pay

## 2021-02-04 ENCOUNTER — Ambulatory Visit (INDEPENDENT_AMBULATORY_CARE_PROVIDER_SITE_OTHER): Payer: Medicare HMO | Admitting: Urology

## 2021-02-04 ENCOUNTER — Encounter: Payer: Self-pay | Admitting: Urology

## 2021-02-04 VITALS — BP 114/70 | HR 87 | Ht 70.0 in | Wt 166.0 lb

## 2021-02-04 DIAGNOSIS — N401 Enlarged prostate with lower urinary tract symptoms: Secondary | ICD-10-CM

## 2021-02-04 DIAGNOSIS — N419 Inflammatory disease of prostate, unspecified: Secondary | ICD-10-CM

## 2021-02-04 LAB — URINALYSIS, COMPLETE
Bilirubin, UA: NEGATIVE
Leukocytes,UA: NEGATIVE
Nitrite, UA: NEGATIVE
Protein,UA: NEGATIVE
RBC, UA: NEGATIVE
Specific Gravity, UA: 1.025 (ref 1.005–1.030)
Urobilinogen, Ur: 0.2 mg/dL (ref 0.2–1.0)
pH, UA: 5.5 (ref 5.0–7.5)

## 2021-02-04 LAB — MICROSCOPIC EXAMINATION
Bacteria, UA: NONE SEEN
Epithelial Cells (non renal): NONE SEEN /hpf (ref 0–10)
RBC, Urine: NONE SEEN /hpf (ref 0–2)

## 2021-02-04 MED ORDER — SILODOSIN 8 MG PO CAPS: 8.0000 mg | ORAL_CAPSULE | Freq: Every day | ORAL | 1 refills | Status: DC

## 2021-02-04 NOTE — Progress Notes (Signed)
02/04/21  Chief Complaint  Patient presents with   Cysto     HPI: 70 year old male with refractory urinary symptoms related to BPH.  He had ejaculatory dysfunction with tamsulosin which was bothersome and dizziness with alfuzosin.  He is interested in pursuing UroLift and presents today to the office for cystoscopy and prostate sizing   Please see previous notes for details.     Refer to rooming tab for vital signs NED. A&Ox3.   No respiratory distress   Abd soft, NT, ND Normal phallus with bilateral descended testicles    Cystoscopy Procedure Note  Patient identification was confirmed, informed consent was obtained, and patient was prepped using Betadine solution.  Lidocaine jelly was administered per urethral meatus.    Preoperative abx where received prior to procedure.     Pre-Procedure: - Inspection reveals a normal caliber urethral meatus.  Procedure: The flexible cystoscope was introduced without difficulty - No urethral strictures/lesions are present. -  Moderate lateral enlargement  prostate  -  Small median lobe   - Bilateral ureteral orifices identified - Bladder mucosa  reveals no ulcers, tumors, or lesions - No bladder stones -Mild trabeculation  Retroflexion shows nonobstructive median lobe   Post-Procedure: - Patient tolerated the procedure well   Prostate transrectal ultrasound sizing   Informed consent was obtained after discussing risks/benefits of the procedure.  A time out was performed to ensure correct patient identity.   Pre-Procedure: -Transrectal probe was placed without difficulty -Transrectal Ultrasound performed revealing a 34 gm prostate measuring 3 x 4.8 x 4.5 cm (length) -No significant hypoechoic or median lobe noted     Assessment/ Plan: Moderate prostate enlargement He would be a candidate for UroLift however his most bothersome symptoms he feels are related to prostatitis with onset of bothersome LUTS after ejaculation  which will persist for 2-3 days Would not recommend UroLift for prostatitis symptoms TURP was discussed however there would be a 70% chance of retrograde ejaculation I did recommend a trial of silodosin x30 days to see if this provides better symptom relief.  Rx sent and he will call back regarding efficacy   [name], MD

## 2021-02-10 ENCOUNTER — Telehealth: Payer: Self-pay

## 2021-02-10 MED ORDER — SILODOSIN 8 MG PO CAPS: 8.0000 mg | ORAL_CAPSULE | Freq: Every day | ORAL | 1 refills | Status: DC

## 2021-02-10 NOTE — Telephone Encounter (Signed)
Patient called stating that the [facility]odosin [facility] will be around $165 a month. He states that he cannot afford this and would like a generic sent in. It was explained that this is a generic and may be a high tier copay on his plan. Patient has tried and failed on Tamsulosin and Alfuzosin. It was recommended for him to use the SingleCare coupon at [employer] for [phone] copay for #30 pills. Patient is agreeable, coupon was sent via text to patient and script was sent to [facility] pharmacy

## 2021-02-11 ENCOUNTER — Other Ambulatory Visit: Payer: Self-pay | Admitting: *Deleted

## 2021-02-11 MED ORDER — SILODOSIN 8 MG PO CAPS: 8.0000 mg | ORAL_CAPSULE | Freq: Every day | ORAL | 1 refills | Status: DC

## 2021-05-12 ENCOUNTER — Other Ambulatory Visit: Payer: Self-pay | Admitting: Nurse Practitioner

## 2021-05-12 DIAGNOSIS — J324 Chronic pansinusitis: Secondary | ICD-10-CM | POA: Diagnosis not present

## 2021-05-12 NOTE — Telephone Encounter (Signed)
Requested Prescriptions  ?Pending Prescriptions Disp Refills  ?? atorvastatin (LIPITOR) 20 MG tablet [Pharmacy Med Name: ATORVASTATIN 20 MG TABLET] 90 tablet 1  ?  Sig: TAKE 1 TABLET BY MOUTH EVERY DAY  ?  ? Cardiovascular:  Antilipid - Statins Failed - 05/12/2021  1:44 AM  ?  ?  Failed - Lipid Panel in normal range within the last 12 months  ?  Cholesterol, Total  ?Date Value Ref Range Status  ?05/27/2020 198 100 - 199 mg/dL Final  ? ?LDL Chol Calc (NIH)  ?Date Value Ref Range Status  ?05/27/2020 131 (H) 0 - 99 mg/dL Final  ? ?HDL  ?Date Value Ref Range Status  ?05/27/2020 45 >39 mg/dL Final  ? ?Triglycerides  ?Date Value Ref Range Status  ?05/27/2020 124 0 - 149 mg/dL Final  ? ?  ?  ?  Passed - Patient is not pregnant  ?  ?  Passed - Valid encounter within last 12 months  ?  Recent Outpatient Visits   ?      ? 7 months ago Upper respiratory tract infection, unspecified type  ? [facility] McElwee, Lauren A, NP  ? 11 months ago Essential hypertension  ? [facility] Larae Grooms, NP  ? 1 year ago Essential hypertension  ? [facility] [place], [place], [place]  ? 2 years ago Mixed hyperlipidemia  ? [facility] [place], [place], [place]  ? 2 years ago Essential hypertension  ? [facility] [place], [place], [place]  ?  ?  ?Future Appointments   ?        ? In 6 months  [facility]  ? In 7 months Stoioff, Verna Czech, MD [facility]  ?  ? ?  ?  ?  ? ? ?

## 2021-06-03 ENCOUNTER — Other Ambulatory Visit: Payer: Self-pay | Admitting: Urology

## 2021-06-05 ENCOUNTER — Other Ambulatory Visit: Payer: Self-pay | Admitting: Nurse Practitioner

## 2021-06-05 DIAGNOSIS — I10 Essential (primary) hypertension: Secondary | ICD-10-CM

## 2021-06-06 NOTE — Telephone Encounter (Signed)
Patient is overdue for appointment. Was due for 6 month f/up in September 2022. Please call to schedule and route to provider for refill.  ?

## 2021-06-06 NOTE — Telephone Encounter (Signed)
Requested medication (s) are due for refill today - yes ? ?Requested medication (s) are on the active medication list -yes ? ?Future visit scheduled -yes ? ?Last refill: 11/30/20 #90 1RF ? ?Notes to clinic: Attempted to call patient to schedule appointment- left message to call office. Patient overdue appointment for BP follow up. ? ?Requested Prescriptions  ?Pending Prescriptions Disp Refills  ? amLODipine (NORVASC) 5 MG tablet [Pharmacy Med Name: AMLODIPINE BESYLATE 5 MG TAB] 90 tablet 1  ?  Sig: TAKE 1 TABLET (5 MG TOTAL) BY MOUTH DAILY.  ?  ? Cardiovascular: Calcium Channel Blockers 2 Failed - 06/05/2021  9:10 AM  ?  ?  Failed - Valid encounter within last 6 months  ?  Recent Outpatient Visits   ? ?      ? 8 months ago Upper respiratory tract infection, unspecified type  ? [facility] McElwee, Lauren A, NP  ? 1 year ago Essential hypertension  ? [facility] Larae Grooms, NP  ? 1 year ago Essential hypertension  ? [facility] [place], [place], [place]  ? 2 years ago Mixed hyperlipidemia  ? [facility] [place], [place], [place]  ? 2 years ago Essential hypertension  ? [facility] [place], [place], [place]  ? ?  ?  ?Future Appointments   ? ?        ? In 5 months  [facility]  ? In 6 months Stoioff, Verna Czech, MD [facility]  ? ?  ? ?  ?  ?  Passed - Last BP in normal range  ?  BP Readings from Last 1 Encounters:  ?02/04/21 114/70  ?  ?  ?  ?  Passed - Last Heart Rate in normal range  ?  Pulse Readings from Last 1 Encounters:  ?02/04/21 87  ?  ?  ?  ?  ? ? ? ?Requested Prescriptions  ?Pending Prescriptions Disp Refills  ? amLODipine (NORVASC) 5 MG tablet [Pharmacy Med Name: AMLODIPINE BESYLATE 5 MG TAB] 90 tablet 1  ?  Sig: TAKE 1 TABLET (5 MG TOTAL) BY MOUTH DAILY.  ?  ? Cardiovascular: Calcium Channel Blockers 2 Failed - 06/05/2021  9:10 AM  ?  ?  Failed - Valid encounter within last 6 months  ?  Recent  Outpatient Visits   ? ?      ? 8 months ago Upper respiratory tract infection, unspecified type  ? [facility] McElwee, Lauren A, NP  ? 1 year ago Essential hypertension  ? [facility] Larae Grooms, NP  ? 1 year ago Essential hypertension  ? [facility] [place], [place], [place]  ? 2 years ago Mixed hyperlipidemia  ? [facility] [place], [place], [place]  ? 2 years ago Essential hypertension  ? [facility] [place], [place], [place]  ? ?  ?  ?Future Appointments   ? ?        ? In 5 months  [facility]  ? In 6 months Stoioff, Verna Czech, MD [facility]  ? ?  ? ?  ?  ?  Passed - Last BP in normal range  ?  BP Readings from Last 1 Encounters:  ?02/04/21 114/70  ?  ?  ?  ?  Passed - Last Heart Rate in normal range  ?  Pulse Readings from Last 1 Encounters:  ?02/04/21 87  ?  ?  ?  ?  ? ? ? ?

## 2021-07-05 NOTE — Progress Notes (Signed)
? ?BP 125/74   Pulse 72   Temp 97.9 ?F (36.6 ?C) (Oral)   Ht 5\' 9"  (1.753 m)   Wt 173 lb 6.4 oz (78.7 kg)   SpO2 98%   BMI 25.61 kg/m?   ? ?Subjective:  ? ? Patient ID: Dennis Mclaughlin, male    DOB: 05/04/1950, 71 y.o.   MRN: [id number] ? ?HPI: ?Dennis Mclaughlin is a 71 y.o. male presenting on 07/06/2021 for comprehensive medical examination. Current medical complaints include: Patient  ? ?He currently lives with: ?Interim Problems from his last visit: no ? ?Please send results to Dr. 09/05/2021. ? ?HYPERTENSION / HYPERLIPIDEMIA ?Satisfied with current treatment? yes ?Duration of hypertension: years ?BP monitoring frequency: not checking ?BP range:  ?BP medication side effects: no ?Past BP meds: amlodipine ?Duration of hyperlipidemia: years ?Cholesterol medication side effects: no ?Cholesterol supplements: none ?Past cholesterol medications: atorvastain (lipitor) ?Medication compliance: excellent compliance ?Aspirin: no ?Recent stressors: no ?Recurrent headaches: no ?Visual changes: no ?Palpitations: no ?Dyspnea: no ?Chest pain: no ?Lower extremity edema: yes ?Dizzy/lightheaded: no ? ?Patient states his legs have been swelling.  They swell by the end of the day then improve at night.  Patient does wears boots up to his calves daily at work.  Denies taking NSAIDS.  ? ?MOOD ?Patient states his mood has been fine.  Denies concerns at visit today.  Denies SI.  ? ?Depression Screen done today and results listed below:  ? ?  07/06/2021  ?  8:32 AM 11/29/2020  ?  9:45 AM 05/27/2020  ?  9:35 AM 02/17/2019  ?  9:49 AM 12/25/2018  ? 10:35 AM  ?Depression screen PHQ 2/9  ?Decreased Interest 0 0 0 0 0  ?Down, Depressed, Hopeless 0 0 0 0 0  ?PHQ - 2 Score 0 0 0 0 0  ?Altered sleeping 0   0 0  ?Tired, decreased energy 0   0 0  ?Change in appetite 0   0 0  ?Feeling bad or failure about yourself  0   0 0  ?Trouble concentrating 0   0 0  ?Moving slowly or fidgety/restless 0   0 0  ?Suicidal thoughts 0   0 0  ?PHQ-9 Score 0   0 0   ?Difficult doing work/chores Not difficult at all      ? ? ?The patient does not have a history of falls. I did complete a risk assessment for falls. A plan of care for falls was documented. ? ? ?Past Medical History:  ?Past Medical History:  ?Diagnosis Date  ? History of prostatitis   ? Hyperlipidemia   ? Low testosterone   ? ? ?Surgical History:  ?Past Surgical History:  ?Procedure Laterality Date  ? BICEPT TENODESIS Right 04/12/2015  ? Procedure: OPEN BICEPS TENODESIS;  Surgeon: 04/14/2015, MD;  Location: [facility];  Service: Orthopedics;  Laterality: Right;  ? COLONOSCOPY W/ BIOPSIES    ? SHOULDER ARTHROSCOPY WITH DISTAL CLAVICLE RESECTION Right 04/12/2015  ? Procedure: SHOULDER ARTHROSCOPY WITH DISTAL CLAVICLE RESECTION;  Surgeon: 04/14/2015, MD;  Location: [facility];  Service: Orthopedics;  Laterality: Right;  ? SHOULDER ARTHROSCOPY WITH SUBACROMIAL DECOMPRESSION Right 04/12/2015  ? Procedure: SHOULDER ARTHROSCOPY WITH SUBACROMIAL DECOMPRESSION DEBRIDEMENT LABRAL TEAR;  Surgeon: 04/14/2015, MD;  Location: [facility];  Service: Orthopedics;  Laterality: Right;  ? ? ?Medications:  ?Current Outpatient Medications on File Prior to Visit  ?Medication Sig  ? ALPRAZolam (XANAX) 0.25 MG tablet Take 1  tablet (0.25 mg total) by mouth daily as needed for anxiety.  ? silodosin (RAPAFLO) 8 MG CAPS capsule TAKE ONE CAPSULE BY MOUTH DAILY WITH BREAKFAST  ? ?No current facility-administered medications on file prior to visit.  ? ? ?Allergies:  ?Allergies  ?Allergen Reactions  ? Alfuzosin   ?  Dizziness   ? ? ?Social History:  ?Social History  ? ?Socioeconomic History  ? Marital status: Married  ?  Spouse name: Not on file  ? Number of children: Not on file  ? Years of education: Not on file  ? Highest education level: Not on file  ?Occupational History  ? Not on file  ?Tobacco Use  ? Smoking status: Former  ?  Types: Cigarettes  ?  Quit date: 01/11/1990  ?  Years  since quitting: 31.5  ? Smokeless tobacco: Never  ?Vaping Use  ? Vaping Use: Never used  ?Substance and Sexual Activity  ? Alcohol use: Yes  ?  Comment: socially  ? Drug use: No  ? Sexual activity: Not on file  ?Other Topics Concern  ? Not on file  ?Social History Narrative  ? Not on file  ? ?Social Determinants of Health  ? ?Financial Resource Strain: Low Risk   ? Difficulty of Paying Living Expenses: Not hard at all  ?Food Insecurity: No Food Insecurity  ? Worried About Programme researcher, broadcasting/film/videounning Out of Food in the Last Year: Never true  ? Ran Out of Food in the Last Year: Never true  ?Transportation Needs: No Transportation Needs  ? Lack of Transportation (Medical): No  ? Lack of Transportation (Non-Medical): No  ?Physical Activity: Inactive  ? Days of Exercise per Week: 0 days  ? Minutes of Exercise per Session: 0 min  ?Stress: Stress Concern Present  ? Feeling of Stress : To some extent  ?Social Connections: Not on file  ?Intimate Partner Violence: Not on file  ? ?Social History  ? ?Tobacco Use  ?Smoking Status Former  ? Types: Cigarettes  ? Quit date: 01/11/1990  ? Years since quitting: 31.5  ?Smokeless Tobacco Never  ? ?Social History  ? ?Substance and Sexual Activity  ?Alcohol Use Yes  ? Comment: socially  ? ? ?Family History:  ?Family History  ?Problem Relation Age of Onset  ? Hypertension Mother   ? Diabetes Maternal Aunt   ? Hearing loss Paternal Uncle   ? Stroke Paternal Uncle   ? Cancer Paternal Grandfather   ?     prostate  ? COPD Neg Hx   ? ? ?Past medical history, surgical history, medications, allergies, family history and social history reviewed with patient today and changes made to appropriate areas of the chart.  ? ?Review of Systems  ?Eyes:  Negative for blurred vision and double vision.  ?Respiratory:  Negative for shortness of breath.   ?Cardiovascular:  Negative for chest pain, palpitations and leg swelling.  ?Neurological:  Negative for dizziness and headaches.  ?Psychiatric/Behavioral:  Negative for  depression and suicidal ideas. The patient is not nervous/anxious.   ?All other ROS negative except what is listed above and in the HPI.  ? ?   ?Objective:  ?  ?BP 125/74   Pulse 72   Temp 97.9 ?F (36.6 ?C) (Oral)   Ht 5\' 9"  (1.753 m)   Wt 173 lb 6.4 oz (78.7 kg)   SpO2 98%   BMI 25.61 kg/m?   ?Wt Readings from Last 3 Encounters:  ?07/06/21 173 lb 6.4 oz (78.7 kg)  ?02/04/21 166 lb (  75.3 kg)  ?12/29/20 166 lb 12.8 oz (75.7 kg)  ?  ?Physical Exam ?Vitals and nursing note reviewed.  ?Constitutional:   ?   General: He is not in acute distress. ?   Appearance: Normal appearance. He is normal weight. He is not ill-appearing, toxic-appearing or diaphoretic.  ?HENT:  ?   Head: Normocephalic.  ?   Right Ear: Tympanic membrane, ear canal and external ear normal.  ?   Left Ear: Tympanic membrane, ear canal and external ear normal.  ?   Nose: Nose normal. No congestion or rhinorrhea.  ?   Mouth/Throat:  ?   Mouth: Mucous membranes are moist.  ?Eyes:  ?   General:     ?   Right eye: No discharge.     ?   Left eye: No discharge.  ?   Extraocular Movements: Extraocular movements intact.  ?   Conjunctiva/sclera: Conjunctivae normal.  ?   Pupils: Pupils are equal, round, and reactive to light.  ?Cardiovascular:  ?   Rate and Rhythm: Normal rate and regular rhythm.  ?   Heart sounds: No murmur heard. ?Pulmonary:  ?   Effort: Pulmonary effort is normal. No respiratory distress.  ?   Breath sounds: Normal breath sounds. No wheezing, rhonchi or rales.  ?Abdominal:  ?   General: Abdomen is flat. Bowel sounds are normal. There is no distension.  ?   Palpations: Abdomen is soft.  ?   Tenderness: There is no abdominal tenderness. There is no guarding.  ?Musculoskeletal:  ?   Cervical back: Normal range of motion and neck supple.  ?Skin: ?   General: Skin is warm and dry.  ?   Capillary Refill: Capillary refill takes less than 2 seconds.  ?Neurological:  ?   General: No focal deficit present.  ?   Mental Status: He is alert and  oriented to person, place, and time.  ?   Cranial Nerves: No cranial nerve deficit.  ?   Motor: No weakness.  ?   Deep Tendon Reflexes: Reflexes normal.  ?Psychiatric:     ?   Mood and Affect: Mood normal.     ?   Behavior: Behavior normal

## 2021-07-06 ENCOUNTER — Encounter: Payer: Self-pay | Admitting: Nurse Practitioner

## 2021-07-06 ENCOUNTER — Ambulatory Visit (INDEPENDENT_AMBULATORY_CARE_PROVIDER_SITE_OTHER): Payer: Medicare HMO | Admitting: Nurse Practitioner

## 2021-07-06 VITALS — BP 125/74 | HR 72 | Temp 97.9°F | Ht 69.0 in | Wt 173.4 lb

## 2021-07-06 DIAGNOSIS — I10 Essential (primary) hypertension: Secondary | ICD-10-CM

## 2021-07-06 DIAGNOSIS — Z0000 Encounter for general adult medical examination without abnormal findings: Secondary | ICD-10-CM

## 2021-07-06 DIAGNOSIS — E782 Mixed hyperlipidemia: Secondary | ICD-10-CM | POA: Diagnosis not present

## 2021-07-06 DIAGNOSIS — R600 Localized edema: Secondary | ICD-10-CM | POA: Diagnosis not present

## 2021-07-06 DIAGNOSIS — F419 Anxiety disorder, unspecified: Secondary | ICD-10-CM | POA: Diagnosis not present

## 2021-07-06 DIAGNOSIS — R69 Illness, unspecified: Secondary | ICD-10-CM | POA: Diagnosis not present

## 2021-07-06 LAB — URINALYSIS, ROUTINE W REFLEX MICROSCOPIC
Bilirubin, UA: NEGATIVE
Glucose, UA: NEGATIVE
Ketones, UA: NEGATIVE
Leukocytes,UA: NEGATIVE
Nitrite, UA: NEGATIVE
Protein,UA: NEGATIVE
RBC, UA: NEGATIVE
Specific Gravity, UA: 1.015 (ref 1.005–1.030)
Urobilinogen, Ur: 0.2 mg/dL (ref 0.2–1.0)
pH, UA: 6.5 (ref 5.0–7.5)

## 2021-07-06 MED ORDER — ATORVASTATIN CALCIUM 20 MG PO TABS: 20.0000 mg | ORAL_TABLET | Freq: Every day | ORAL | 1 refills | Status: DC

## 2021-07-06 MED ORDER — AMLODIPINE BESYLATE 5 MG PO TABS: 5.0000 mg | ORAL_TABLET | Freq: Every day | ORAL | 1 refills | Status: DC

## 2021-07-06 NOTE — Assessment & Plan Note (Signed)
Chronic.  Controlled.  Continue with current medication regimen.  Refills sent today.  Labs ordered today.  Return to clinic in 6 months for reevaluation.  Call sooner if concerns arise.  ° °

## 2021-07-06 NOTE — Assessment & Plan Note (Signed)
Chronic.  Controlled.  Continue with current medication regimen of Atorvastatin 20mg.  Refill sent today.  Labs ordered today.  Return to clinic in 6 months for reevaluation.  Call sooner if concerns arise.   

## 2021-07-06 NOTE — Assessment & Plan Note (Signed)
Chronic.  Controlled without medication..  Labs ordered today.  Return to clinic in 6 months for reevaluation.  Call sooner if concerns arise.  ° °

## 2021-07-07 LAB — COMPREHENSIVE METABOLIC PANEL
ALT: 24 IU/L (ref 0–44)
AST: 19 IU/L (ref 0–40)
Albumin/Globulin Ratio: 2 (ref 1.2–2.2)
Albumin: 4.4 g/dL (ref 3.8–4.8)
Alkaline Phosphatase: 90 IU/L (ref 44–121)
BUN/Creatinine Ratio: 14 (ref 10–24)
BUN: 12 mg/dL (ref 8–27)
Bilirubin Total: 0.5 mg/dL (ref 0.0–1.2)
CO2: 24 mmol/L (ref 20–29)
Calcium: 9 mg/dL (ref 8.6–10.2)
Chloride: 103 mmol/L (ref 96–106)
Creatinine, Ser: 0.84 mg/dL (ref 0.76–1.27)
Globulin, Total: 2.2 g/dL (ref 1.5–4.5)
Glucose: 100 mg/dL — ABNORMAL HIGH (ref 70–99)
Potassium: 4.4 mmol/L (ref 3.5–5.2)
Sodium: 140 mmol/L (ref 134–144)
Total Protein: 6.6 g/dL (ref 6.0–8.5)
eGFR: 95 mL/min/{1.73_m2} (ref >59)

## 2021-07-07 LAB — CBC WITH DIFFERENTIAL/PLATELET
Basophils Absolute: 0 10*3/uL (ref 0.0–0.2)
Basos: 1 %
EOS (ABSOLUTE): 0.1 10*3/uL (ref 0.0–0.4)
Eos: 1 %
Hematocrit: 41.4 % (ref 37.5–51.0)
Hemoglobin: 13.9 g/dL (ref 13.0–17.7)
Immature Grans (Abs): 0 10*3/uL (ref 0.0–0.1)
Immature Granulocytes: 0 %
Lymphocytes Absolute: 1.8 10*3/uL (ref 0.7–3.1)
Lymphs: 28 %
MCH: 31.5 pg (ref 26.6–33.0)
MCHC: 33.6 g/dL (ref 31.5–35.7)
MCV: 94 fL (ref 79–97)
Monocytes Absolute: 0.5 10*3/uL (ref 0.1–0.9)
Monocytes: 9 %
Neutrophils Absolute: 3.9 10*3/uL (ref 1.4–7.0)
Neutrophils: 61 %
Platelets: 224 10*3/uL (ref 150–450)
RBC: 4.41 x10E6/uL (ref 4.14–5.80)
RDW: 11.9 % (ref 11.6–15.4)
WBC: 6.3 10*3/uL (ref 3.4–10.8)

## 2021-07-07 LAB — LIPID PANEL
Chol/HDL Ratio: 4 ratio (ref 0.0–5.0)
Cholesterol, Total: 198 mg/dL (ref 100–199)
HDL: 49 mg/dL (ref >39)
LDL Chol Calc (NIH): 125 mg/dL — ABNORMAL HIGH (ref 0–99)
Triglycerides: 137 mg/dL (ref 0–149)
VLDL Cholesterol Cal: 24 mg/dL (ref 5–40)

## 2021-07-07 LAB — TSH: TSH: 4.1 u[IU]/mL (ref 0.450–4.500)

## 2021-07-07 LAB — PSA: Prostate Specific Ag, Serum: 1.2 ng/mL (ref 0.0–4.0)

## 2021-07-07 NOTE — Progress Notes (Signed)
Please let patient know that overall his lab work looks good. His cholesterol is elevated. Make sure to continue with the atorvastatin. Otherwise, his blood work looks good.  No other concerns at this time.  Follow up as discussed.

## 2021-10-07 ENCOUNTER — Ambulatory Visit: Payer: Self-pay

## 2021-10-07 NOTE — Telephone Encounter (Signed)
  Chief Complaint: leg swelling Symptoms: leg swelling up to knees bilat.  Frequency: 2-3 weeks Pertinent Negatives: Patient denies pain in LE Disposition: [] ED /[] Urgent Care (no appt availability in office) / [x] Appointment(In office/virtual)/ []  [place] [facility]/ [] Home Care/ [] Refused Recommended Disposition /[] [place] [facility]/ []  Follow-up with PCP Additional Notes: pt is unsure if related to amlodipine but wanting to come in and see if that's causing swelling. Scheduled appt for 10/10/21 at 1540. Advised pt if possible to elevate LE when available.   Reason for Disposition  [1] MODERATE leg swelling (e.g., swelling extends up to knees) AND [2] new-onset or worsening  Answer Assessment - Initial Assessment Questions 1. ONSET: "When did the swelling start?" (e.g., minutes, hours, days)     2-3 weeks  2. LOCATION: "What part of the leg is swollen?"  "Are both legs swollen or just one leg?"    Both bilat LE up to knee 3. SEVERITY: "How bad is the swelling?" (e.g., localized; mild, moderate, severe)   - Localized: Small area of swelling localized to one leg.   - MILD pedal edema: Swelling limited to foot and ankle, pitting edema < 1/4 inch (6 mm) deep, rest and elevation eliminate most or all swelling.   - MODERATE edema: Swelling of lower leg to knee, pitting edema > 1/4 inch (6 mm) deep, rest and elevation only partially reduce swelling.   - SEVERE edema: Swelling extends above knee, facial or hand swelling present.      Mild  4. REDNESS: "Does the swelling look red or infected?"     Slight redness 5. PAIN: "Is the swelling painful to touch?" If Yes, ask: "How painful is it?"   (Scale 1-10; mild, moderate or severe)     no 10. OTHER SYMPTOMS: "Do you have any other symptoms?" (e.g., chest pain, difficulty breathing)       Neuropathy in feet  Protocols used: Leg Swelling and Edema-A-AH

## 2021-10-10 ENCOUNTER — Ambulatory Visit (INDEPENDENT_AMBULATORY_CARE_PROVIDER_SITE_OTHER): Payer: Medicare HMO | Admitting: Physician Assistant

## 2021-10-10 ENCOUNTER — Encounter: Payer: Self-pay | Admitting: Physician Assistant

## 2021-10-10 VITALS — BP 134/77 | HR 87 | Temp 98.2°F | Wt 180.7 lb

## 2021-10-10 DIAGNOSIS — I10 Essential (primary) hypertension: Secondary | ICD-10-CM | POA: Diagnosis not present

## 2021-10-10 DIAGNOSIS — R200 Anesthesia of skin: Secondary | ICD-10-CM | POA: Diagnosis not present

## 2021-10-10 DIAGNOSIS — R202 Paresthesia of skin: Secondary | ICD-10-CM

## 2021-10-10 MED ORDER — HYDROCHLOROTHIAZIDE 12.5 MG PO TABS: 12.5000 mg | ORAL_TABLET | Freq: Every day | ORAL | 1 refills | Status: DC

## 2021-10-10 NOTE — Assessment & Plan Note (Signed)
Chronic, ongoing Pt reports bilateral foot burning and stinging that has been ongoing for several years Discussed potential causes- will implement plantar fasciitis management at this time - recommend supportive insoles, massage, braces to assist with symptoms If this is not helping, may need to explore neuropathy and order nerve conduction study to assist with dx.  Follow up as needed

## 2021-10-10 NOTE — Assessment & Plan Note (Signed)
Chronic, appears controlled Patient is concerned for LE edema and is thinking Amlodipine is cause He would like to switch to another agent to help improve this  Will dc Amlodipine and start HCTZ 12.5 mg PO QD with goal of maintaining appropriate BP and helping with swelling Also discussed use of compression stockings, elevation of LE  Follow up in 4-6 weeks to assess response to medication changes.

## 2021-10-10 NOTE — Progress Notes (Signed)
Established Patient Office Visit  Name: Dennis Mclaughlin   MRN: [id number]    DOB: Jul 25, 1950   Date:10/10/2021  Today's Provider: Jacquelin Hawking, MHS, PA-C Introduced myself to the patient as a PA-C and provided education on APPs in clinical practice.         Subjective  Chief Complaint  Chief Complaint  Patient presents with   Leg Swelling    Pt states he has been noticing bilateral lower leg swelling recently. States his feet and toes feel dumb and tingling as well.     HPI  Reports he is having burning, stinging, and swelling in both legs States this has been ongoing for about 2 years States he went to podiatry and was advised that it is probably neuropathy  Reports swelling seems to have started after he started the Amlodipine Reports burning and stinging is constant  Pain level: 6/10 States burning sensation is on soles and toes feel numb  He has not tried compression socks but has been trying to keep feet elevated at the end of the day      Patient Active Problem List   Diagnosis Date Noted   Numbness and tingling of foot 10/10/2021   Anxiety 08/18/2019   Allergic rhinitis 12/25/2018   Essential hypertension 12/25/2018   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   Hyperlipidemia 01/12/2015   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   Low testosterone 01/12/2015    Past Surgical History:  Procedure Laterality Date   BICEPT TENODESIS Right 04/12/2015   Procedure: OPEN BICEPS TENODESIS;  Surgeon: Jones Broom, MD;  Location: [facility];  Service: Orthopedics;  Laterality: Right;   COLONOSCOPY W/ BIOPSIES     SHOULDER ARTHROSCOPY WITH DISTAL CLAVICLE RESECTION Right 04/12/2015   Procedure: SHOULDER ARTHROSCOPY WITH DISTAL CLAVICLE RESECTION;  Surgeon: Jones Broom, MD;  Location: [facility];  Service: Orthopedics;  Laterality: Right;   SHOULDER ARTHROSCOPY WITH SUBACROMIAL DECOMPRESSION Right 04/12/2015   Procedure:  SHOULDER ARTHROSCOPY WITH SUBACROMIAL DECOMPRESSION DEBRIDEMENT LABRAL TEAR;  Surgeon: Jones Broom, MD;  Location:  S[facility];  Service: Orthopedics;  Laterality: Right;    Family History  Problem Relation Age of Onset   Hypertension Mother    Diabetes Maternal Aunt    Hearing loss Paternal Uncle    Stroke Paternal Uncle    Cancer Paternal Grandfather        prostate   COPD Neg Hx     Social History   Tobacco Use   Smoking status: Former    Types: Cigarettes    Quit date: 01/11/1990    Years since quitting: 31.7   Smokeless tobacco: Never  Substance Use Topics   Alcohol use: Yes    Comment: socially     Current Outpatient Medications:    atorvastatin (LIPITOR) 20 MG tablet, Take 1 tablet (20 mg total) by mouth daily., Disp: 90 tablet, Rfl: 1   hydrochlorothiazide (HYDRODIURIL) 12.5 MG tablet, Take 1 tablet (12.5 mg total) by mouth daily., Disp: 30 tablet, Rfl: 1  Allergies  Allergen Reactions   Alfuzosin     Dizziness     I personally reviewed active problem list, medication list, allergies, notes from last encounter, lab results with the patient/caregiver today.   Review of Systems  Cardiovascular:  Positive for leg swelling.  Neurological:  Positive for tingling.      Objective  Vitals:   10/10/21 1543  BP: 134/77  Pulse: 87  Temp: 98.2 F (36.8 C)  TempSrc: Oral  SpO2: 98%  Weight: 180 lb 11.2 oz (82 kg)    Body mass index is 26.69 kg/m.  Physical Exam Vitals reviewed.  Constitutional:      General: He is awake.     Appearance: Normal appearance. He is well-developed, well-groomed and normal weight.  HENT:     Head: Normocephalic and atraumatic.  Cardiovascular:     Rate and Rhythm: Normal rate and regular rhythm.     Pulses: Normal pulses.     Heart sounds: Normal heart sounds. No murmur heard.    No friction rub. No gallop.  Pulmonary:     Effort: Pulmonary effort is normal.     Breath sounds: Normal breath sounds.  No decreased air movement. No decreased breath sounds, wheezing, rhonchi or rales.  Musculoskeletal:     Cervical back: Normal range of motion and neck supple.     Right lower leg: Edema present.     Left lower leg: Edema present.     Right foot: Normal range of motion. No deformity.     Left foot: Normal range of motion. No deformity.  Feet:     Right foot:     Skin integrity: No ulcer, blister or erythema.     Toenail Condition: Right toenails are normal.     Left foot:     Skin integrity: No ulcer, blister or erythema.     Toenail Condition: Left toenails are normal.  Neurological:     Mental Status: He is alert.  Psychiatric:        Attention and Perception: Attention and perception normal.        Mood and Affect: Mood and affect normal.        Speech: Speech normal.        Behavior: Behavior normal. Behavior is cooperative.      No results found for this or any previous visit (from the past 2160 hour(s)).   PHQ2/9:    07/06/2021    8:32 AM 11/29/2020    9:45 AM 05/27/2020    9:35 AM 02/17/2019    9:49 AM 12/25/2018   10:35 AM  Depression screen PHQ 2/9  Decreased Interest 0 0 0 0 0  Down, Depressed, Hopeless 0 0 0 0 0  PHQ - 2 Score 0 0 0 0 0  Altered sleeping 0   0 0  Tired, decreased energy 0   0 0  Change in appetite 0   0 0  Feeling bad or failure about yourself  0   0 0  Trouble concentrating 0   0 0  Moving slowly or fidgety/restless 0   0 0  Suicidal thoughts 0   0 0  PHQ-9 Score 0   0 0  Difficult doing work/chores Not difficult at all          Fall Risk:    07/06/2021    8:32 AM 11/29/2020    9:45 AM 05/27/2020    9:35 AM 02/17/2019    9:48 AM 12/25/2018   10:34 AM  Fall Risk   Falls in the past year? 0 0 0 0 0  Number falls in past yr: 0  0 0 0  Injury with Fall? 0  0 0 0  Risk for fall due to : No Fall Risks Medication side effect     Follow up Falls evaluation completed Falls evaluation completed;Education provided;Falls prevention discussed  Functional Status Survey:      Assessment & Plan  Problem List Items Addressed This Visit       Cardiovascular and Mediastinum   Essential hypertension - Primary    Chronic, appears controlled Patient is concerned for LE edema and is thinking Amlodipine is cause He would like to switch to another agent to help improve this  Will dc Amlodipine and start HCTZ 12.5 mg PO QD with goal of maintaining appropriate BP and helping with swelling Also discussed use of compression stockings, elevation of LE  Follow up in 4-6 weeks to assess response to medication changes.        Relevant Medications   hydrochlorothiazide (HYDRODIURIL) 12.5 MG tablet     Other   Numbness and tingling of foot    Chronic, ongoing Pt reports bilateral foot burning and stinging that has been ongoing for several years Discussed potential causes- will implement plantar fasciitis management at this time - recommend supportive insoles, massage, braces to assist with symptoms If this is not helping, may need to explore neuropathy and order nerve conduction study to assist with dx.  Follow up as needed         Return in about 4 weeks (around 11/07/2021) for HTN medication change.   I, Jadore Mcguffin E Berania Peedin, PA-C, have reviewed all documentation for this visit. The documentation on 10/10/21 for the exam, diagnosis, procedures, and orders are all accurate and complete.   Jacquelin Hawking, MHS, PA-C [facility]

## 2021-10-10 NOTE — Patient Instructions (Addendum)
I recommend trying the following for your feet  Using insoles that help support your aches  Massaging the bottom of your feet and doing the exercises included You can use Compression stockings with a compression force of 15 -25 mmHg  Put these on in the AM and take off in the evening    To help with the swelling I recommend the following; Stop taking the Amlodipine  Start taking the Hydrochlorothiazide once per day   Take your BP at home daily so we can see how you are responding

## 2021-11-02 ENCOUNTER — Other Ambulatory Visit: Payer: Self-pay | Admitting: Physician Assistant

## 2021-11-02 DIAGNOSIS — I10 Essential (primary) hypertension: Secondary | ICD-10-CM

## 2021-11-03 NOTE — Telephone Encounter (Signed)
last RF 10/10/21 #30 1 RF  Requested Prescriptions  Refused Prescriptions Disp Refills  . hydrochlorothiazide (HYDRODIURIL) 12.5 MG tablet [Pharmacy Med Name: HYDROCHLOROTHIAZIDE 12.5 MG TB] 30 tablet 1    Sig: TAKE 1 TABLET BY MOUTH EVERY DAY     Cardiovascular: Diuretics - Thiazide Passed - 11/02/2021  9:33 AM      Passed - Cr in normal range and within 180 days    Creatinine, Ser  Date Value Ref Range Status  07/06/2021 0.84 0.76 - 1.27 mg/dL Final         Passed - K in normal range and within 180 days    Potassium  Date Value Ref Range Status  07/06/2021 4.4 3.5 - 5.2 mmol/L Final         Passed - Na in normal range and within 180 days    Sodium  Date Value Ref Range Status  07/06/2021 140 134 - 144 mmol/L Final         Passed - Last BP in normal range    BP Readings from Last 1 Encounters:  10/10/21 134/77         Passed - Valid encounter within last 6 months    Recent Outpatient Visits          3 weeks ago Essential hypertension   [facility] Mecum, Oswaldo Conroy, PA-C   4 months ago Annual physical exam   [facility] Larae Grooms, NP   1 year ago Upper respiratory tract infection, unspecified type   [facility] McElwee, Lauren A, NP   1 year ago Essential hypertension   [facility] Larae Grooms, NP   2 years ago Essential hypertension   [facility] [place], Salley Hews, [place]      Future Appointments            In 6 days Larae Grooms, NP [facility]   In 4 weeks  [facility]   In 1 month Stoioff, Verna Czech, MD [employer]   In 2 months Larae Grooms, NP [facility]

## 2021-11-09 ENCOUNTER — Encounter: Payer: Self-pay | Admitting: Nurse Practitioner

## 2021-11-09 ENCOUNTER — Ambulatory Visit (INDEPENDENT_AMBULATORY_CARE_PROVIDER_SITE_OTHER): Payer: Medicare HMO | Admitting: Nurse Practitioner

## 2021-11-09 VITALS — BP 134/80 | HR 70 | Temp 98.6°F | Wt 184.2 lb

## 2021-11-09 DIAGNOSIS — I10 Essential (primary) hypertension: Secondary | ICD-10-CM

## 2021-11-09 DIAGNOSIS — F4321 Adjustment disorder with depressed mood: Secondary | ICD-10-CM | POA: Diagnosis not present

## 2021-11-09 DIAGNOSIS — R69 Illness, unspecified: Secondary | ICD-10-CM | POA: Diagnosis not present

## 2021-11-09 MED ORDER — HYDROCHLOROTHIAZIDE 12.5 MG PO TABS: 12.5000 mg | ORAL_TABLET | Freq: Every day | ORAL | 1 refills | Status: DC

## 2021-11-09 NOTE — Assessment & Plan Note (Addendum)
Pt BP log since change in anti-HTN medication reviewed.  Pt BP 110-130s. No c/o hypotensive symptoms.  Continuing HCTZ due to favorable effect on BP management. Refill sent for 90 day supply.

## 2021-11-09 NOTE — Progress Notes (Signed)
BP 134/80   Pulse 70   Temp 98.6 F (37 C) (Oral)   Wt 184 lb 3.2 oz (83.6 kg)   SpO2 96%   BMI 27.20 kg/m    Subjective:    Patient ID: Dennis Mclaughlin, male    DOB: [birth year], 71 y.o.   MRN: [id number]  HPI: Dennis Mclaughlin is a 71 y.o. male  NOTE WRITTEN BY DNP STUDENT.  ASSESSMENT AND PLAN OF CARE REVIEWED WITH STUDENT, AGREE WITH ABOVE FINDINGS AND PLAN.   C/o concerns for increased bloating and decrease in appetite.  Has been an issue for a couple of months off and on and concerned that he isnt feeling hungry.States he is eating one meal a day, tends to be a bigger meal.  Pt states change in bowels, still once a day but less than in the past, but reports his appetite is lower.  States he is maintaining hydration. Denies constant belching and consistent fullness following meals.  When asking what has occurred in last 2 months, he mentioned he lost his mother in June of this year. He has stated he has felt more down that usual, but feels he just needs time.  Denies suicidal/homicidal ideations.  Pt concerned about weight gain or inability to lose weight when he is only eating one meal/day.   HYPERTENSION with Chronic Kidney Disease Hypertension status: controlled  Satisfied with current treatment? yes Duration of hypertension: years BP monitoring frequency:  a few times a day BP range: 110-130s BP medication side effects:  no Medication compliance: good compliance Previous BP meds:HCTZ Aspirin: no Recurrent headaches: no Visual changes: no Palpitations: no Dyspnea: no Chest pain: no Lower extremity edema: no Dizzy/lightheaded: no  Relevant past medical, surgical, family and social history reviewed and updated as indicated. Interim medical history since our last visit reviewed. Allergies and medications reviewed and updated.  Review of Systems  Constitutional:  Positive for appetite change. Negative for activity change, chills, diaphoresis, fatigue, fever and  unexpected weight change.  Respiratory:  Negative for cough, choking, chest tightness and shortness of breath.   Cardiovascular:  Negative for chest pain, palpitations and leg swelling.  Gastrointestinal:  Positive for abdominal distention (Bloating). Negative for abdominal pain, blood in stool, constipation, diarrhea, nausea and vomiting.  Neurological:  Negative for dizziness, syncope, weakness, light-headedness, numbness and headaches.  Psychiatric/Behavioral:  Negative for behavioral problems, dysphoric mood, self-injury, sleep disturbance and suicidal ideas. The patient is not nervous/anxious.     Per HPI unless specifically indicated above     Objective:    BP 134/80   Pulse 70   Temp 98.6 F (37 C) (Oral)   Wt 184 lb 3.2 oz (83.6 kg)   SpO2 96%   BMI 27.20 kg/m   Wt Readings from Last 3 Encounters:  11/09/21 184 lb 3.2 oz (83.6 kg)  10/10/21 180 lb 11.2 oz (82 kg)  07/06/21 173 lb 6.4 oz (78.7 kg)    Physical Exam Constitutional:      General: He is not in acute distress.    Appearance: He is normal weight.  HENT:     Head: Normocephalic.     Nose: Nose normal.  Eyes:     Extraocular Movements: Extraocular movements intact.     Pupils: Pupils are equal, round, and reactive to light.  Cardiovascular:     Rate and Rhythm: Normal rate and regular rhythm.     Pulses: Normal pulses.     Heart sounds: Normal heart sounds. No  murmur heard.    No gallop.  Pulmonary:     Effort: Pulmonary effort is normal. No respiratory distress.     Breath sounds: Normal breath sounds. No wheezing or rhonchi.  Chest:     Chest wall: No tenderness.  Abdominal:     General: Bowel sounds are normal. There is no distension.     Palpations: Abdomen is soft. There is no mass.     Tenderness: There is no abdominal tenderness. There is no guarding.  Musculoskeletal:        General: No swelling or tenderness.  Skin:    General: Skin is warm and dry.     Capillary Refill: Capillary refill  takes 2 to 3 seconds.  Neurological:     General: No focal deficit present.     Mental Status: He is alert and oriented to person, place, and time. Mental status is at baseline.  Psychiatric:        Mood and Affect: Mood normal.        Behavior: Behavior normal.        Thought Content: Thought content normal.        Judgment: Judgment normal.     Results for orders placed or performed in visit on 07/06/21  TSH  Result Value Ref Range   TSH 4.100 0.450 - 4.500 uIU/mL  PSA  Result Value Ref Range   Prostate Specific Ag, Serum 1.2 0.0 - 4.0 ng/mL  Lipid panel  Result Value Ref Range   Cholesterol, Total 198 100 - 199 mg/dL   Triglycerides 137 0 - 149 mg/dL   HDL 49 >39 mg/dL   VLDL Cholesterol Cal 24 5 - 40 mg/dL   LDL Chol Calc (NIH) 125 (H) 0 - 99 mg/dL   Chol/HDL Ratio 4.0 0.0 - 5.0 ratio  CBC with Differential/Platelet  Result Value Ref Range   WBC 6.3 3.4 - 10.8 x10E3/uL   RBC 4.41 4.14 - 5.80 x10E6/uL   Hemoglobin 13.9 13.0 - 17.7 g/dL   Hematocrit 41.4 37.5 - 51.0 %   MCV 94 79 - 97 fL   MCH 31.5 26.6 - 33.0 pg   MCHC 33.6 31.5 - 35.7 g/dL   RDW 11.9 11.6 - 15.4 %   Platelets 224 150 - 450 x10E3/uL   Neutrophils 61 Not Estab. %   Lymphs 28 Not Estab. %   Monocytes 9 Not Estab. %   Eos 1 Not Estab. %   Basos 1 Not Estab. %   Neutrophils Absolute 3.9 1.4 - 7.0 x10E3/uL   Lymphocytes Absolute 1.8 0.7 - 3.1 x10E3/uL   Monocytes Absolute 0.5 0.1 - 0.9 x10E3/uL   EOS (ABSOLUTE) 0.1 0.0 - 0.4 x10E3/uL   Basophils Absolute 0.0 0.0 - 0.2 x10E3/uL   Immature Granulocytes 0 Not Estab. %   Immature Grans (Abs) 0.0 0.0 - 0.1 x10E3/uL  Comprehensive metabolic panel  Result Value Ref Range   Glucose 100 (H) 70 - 99 mg/dL   BUN 12 8 - 27 mg/dL   Creatinine, Ser 0.84 0.76 - 1.27 mg/dL   eGFR 95 >59 mL/min/1.73   BUN/Creatinine Ratio 14 10 - 24   Sodium 140 134 - 144 mmol/L   Potassium 4.4 3.5 - 5.2 mmol/L   Chloride 103 96 - 106 mmol/L   CO2 24 20 - 29 mmol/L   Calcium  9.0 8.6 - 10.2 mg/dL   Total Protein 6.6 6.0 - 8.5 g/dL   Albumin 4.4 3.8 - 4.8 g/dL   Globulin,  Total 2.2 1.5 - 4.5 g/dL   Albumin/Globulin Ratio 2.0 1.2 - 2.2   Bilirubin Total 0.5 0.0 - 1.2 mg/dL   Alkaline Phosphatase 90 44 - 121 IU/L   AST 19 0 - 40 IU/L   ALT 24 0 - 44 IU/L  Urinalysis, Routine w reflex microscopic  Result Value Ref Range   Specific Gravity, UA 1.015 1.005 - 1.030   pH, UA 6.5 5.0 - 7.5   Color, UA Yellow Yellow   Appearance Ur Clear Clear   Leukocytes,UA Negative Negative   Protein,UA Negative Negative/Trace   Glucose, UA Negative Negative   Ketones, UA Negative Negative   RBC, UA Negative Negative   Bilirubin, UA Negative Negative   Urobilinogen, Ur 0.2 0.2 - 1.0 mg/dL   Nitrite, UA Negative Negative      Assessment & Plan:   Problem List Items Addressed This Visit       Cardiovascular and Mediastinum   Essential hypertension - Primary    Pt BP log since change in anti-HTN medication reviewed.  Pt BP 110-130s. No c/o hypotensive symptoms.  Continuing HCTZ due to favorable effect on BP management. Refill sent for 90 day supply.      Relevant Medications   hydrochlorothiazide (HYDRODIURIL) 12.5 MG tablet     Other   Grief    Pt expresses feelings of sadness since the passing of his mother in June. GI complaints seem r/t inconsistent and lower appetite. Pt did not prefer to complete PHQ-9 when asked. Pt denies suicidal/homicidal ideations. Pt encouraged to notify clinic if his down feelings worsen and do not improve with time or begin to affect his daily routine and activity level.        Follow up plan: Return in about 5 months (around 04/11/2022) for HTN, HLD, DM2 FU.

## 2021-11-09 NOTE — Assessment & Plan Note (Addendum)
Pt expresses feelings of sadness since the passing of his mother in June. GI complaints seem r/t inconsistent and lower appetite. Pt did not prefer to complete PHQ-9 when asked. Pt denies suicidal/homicidal ideations. Pt encouraged to notify clinic if his down feelings worsen and do not improve with time or begin to affect his daily routine and activity level.

## 2021-11-28 ENCOUNTER — Encounter: Payer: Self-pay | Admitting: Nurse Practitioner

## 2021-12-01 ENCOUNTER — Ambulatory Visit: Payer: Medicare HMO

## 2021-12-02 ENCOUNTER — Ambulatory Visit: Payer: Medicare HMO

## 2021-12-29 ENCOUNTER — Encounter: Payer: Self-pay | Admitting: Urology

## 2021-12-29 ENCOUNTER — Ambulatory Visit (INDEPENDENT_AMBULATORY_CARE_PROVIDER_SITE_OTHER): Payer: Medicare HMO | Admitting: Urology

## 2021-12-29 VITALS — BP 153/79 | HR 96 | Ht 69.0 in | Wt 188.0 lb

## 2021-12-29 DIAGNOSIS — G894 Chronic pain syndrome: Secondary | ICD-10-CM

## 2021-12-29 DIAGNOSIS — R3914 Feeling of incomplete bladder emptying: Secondary | ICD-10-CM | POA: Diagnosis not present

## 2021-12-29 DIAGNOSIS — N401 Enlarged prostate with lower urinary tract symptoms: Secondary | ICD-10-CM

## 2021-12-29 LAB — URINALYSIS, COMPLETE
Bilirubin, UA: NEGATIVE
Glucose, UA: NEGATIVE
Ketones, UA: NEGATIVE
Leukocytes,UA: NEGATIVE
Nitrite, UA: NEGATIVE
Protein,UA: NEGATIVE
RBC, UA: NEGATIVE
Specific Gravity, UA: 1.02 (ref 1.005–1.030)
Urobilinogen, Ur: 0.2 mg/dL (ref 0.2–1.0)
pH, UA: 5 (ref 5.0–7.5)

## 2021-12-29 LAB — MICROSCOPIC EXAMINATION

## 2021-12-29 LAB — BLADDER SCAN AMB NON-IMAGING: Scan Result: 6

## 2021-12-29 MED ORDER — AMITRIPTYLINE HCL 25 MG PO TABS: 25.0000 mg | ORAL_TABLET | Freq: Every day | ORAL | 1 refills | Status: DC

## 2021-12-29 NOTE — Progress Notes (Signed)
12/29/2021 12:26 PM   Dennis Mclaughlin 1950/10/08 [id number]  Referring provider: Jon Billings, NP [id number] [place],  [place] [address]  Chief Complaint  Patient presents with   Benign Prostatic Hypertrophy    HPI: 71 y.o. male presents for follow-up visit.  Refer to my previous note 12/29/2020 for a clinical summary Cystoscopy December 2022 showed moderate prostate enlargement and prostate volume was 34 g on TRUS He has been unable to tolerate tamsulosin, alfuzosin or silodosin secondary to side effects Presently complains of suprapubic discomfort and sensation of incomplete emptying. His voiding symptoms are significantly worse for 2-3 days after ejaculation   PMH: Past Medical History:  Diagnosis Date   History of prostatitis    Hyperlipidemia    Low testosterone     Surgical History: Past Surgical History:  Procedure Laterality Date   BICEPT TENODESIS Right 04/12/2015   Procedure: OPEN BICEPS TENODESIS;  Surgeon: Tania Ade, MD;  Location: [facility];  Service: Orthopedics;  Laterality: Right;   COLONOSCOPY W/ BIOPSIES     SHOULDER ARTHROSCOPY WITH DISTAL CLAVICLE RESECTION Right 04/12/2015   Procedure: SHOULDER ARTHROSCOPY WITH DISTAL CLAVICLE RESECTION;  Surgeon: Tania Ade, MD;  Location: [facility];  Service: Orthopedics;  Laterality: Right;   SHOULDER ARTHROSCOPY WITH SUBACROMIAL DECOMPRESSION Right 04/12/2015   Procedure: SHOULDER ARTHROSCOPY WITH SUBACROMIAL DECOMPRESSION DEBRIDEMENT LABRAL TEAR;  Surgeon: Tania Ade, MD;  Location: [facility];  Service: Orthopedics;  Laterality: Right;    Home Medications:  Allergies as of 12/29/2021       Reactions   Alfuzosin    Dizziness         Medication List        Accurate as of December 29, 2021 12:26 PM. If you have any questions, ask your nurse or doctor.          atorvastatin 20 MG tablet Commonly known as: LIPITOR Take 1 tablet (20 mg  total) by mouth daily.   hydrochlorothiazide 12.5 MG tablet Commonly known as: HYDRODIURIL Take 1 tablet (12.5 mg total) by mouth daily.        Allergies:  Allergies  Allergen Reactions   Alfuzosin     Dizziness     Family History: Family History  Problem Relation Age of Onset   Hypertension Mother    Diabetes Maternal Aunt    Hearing loss Paternal Uncle    Stroke Paternal Uncle    Cancer Paternal Grandfather        prostate   COPD Neg Hx     Social History:  reports that he quit smoking about 31 years ago. His smoking use included cigarettes. He has never used smokeless tobacco. He reports current alcohol use. He reports that he does not use drugs.   Physical Exam: BP (!) 153/79   Pulse 96   Ht 5\' 9"  (1.753 m)   Wt 188 lb (85.3 kg)   BMI 27.76 kg/m   Constitutional:  Alert and oriented, No acute distress. HEENT: [place] AT Respiratory: Normal respiratory effort, no increased work of breathing. GU: Prostate 40 g, smooth without nodules.  Mild-moderate prostatic tenderness and pelvic floor tenderness Psychiatric: Normal mood and affect.  Laboratory Data:  Urinalysis Dipstick/microscopy negative   Assessment & Plan:   The majority of his symptoms may be secondary to chronic prostatitis/chronic pelvic pain syndrome Unable to tolerate alpha-blocker therapy PVR today 6 mL Trial amitriptyline 25 mg at bedtime.  We discussed possible side effect of drowsiness Pelvic  CT for evaluation of his pelvic pain Follow-up visit 4-6 weeks Will discuss pelvic floor PT on follow-up for persistent symptoms   [name], MD  [facility] [address] [place], [place] [address] [phone]

## 2022-01-05 NOTE — Progress Notes (Signed)
BP 126/77   Pulse 85   Temp 98.2 F (36.8 C) (Oral)   Wt 191 lb 14.4 oz (87 kg)   SpO2 95%   BMI 28.34 kg/m    Subjective:    Patient ID: Dennis Mclaughlin, male    DOB: 02/13/1951, 71 y.o.   MRN: [id number]  HPI: Dennis Mclaughlin is a 71 y.o. male presenting on 01/09/2022 for comprehensive medical examination. Current medical complaints include:none  He currently lives with: Interim Problems from his last visit: no  HYPERTENSION / HYPERLIPIDEMIA Satisfied with current treatment? yes Duration of hypertension: years BP monitoring frequency: not checking BP range:  BP medication side effects: no Past BP meds: HCTZ Duration of hyperlipidemia: years Cholesterol medication side effects: no Cholesterol supplements: none Past cholesterol medications: atorvastain (lipitor) Medication compliance: excellent compliance Aspirin: no Recent stressors: no Recurrent headaches: no Visual changes: no Palpitations: no Dyspnea: no Chest pain: no Lower extremity edema: no Dizzy/lightheaded: no   Functional Status Survey:    FALL RISK:    07/06/2021    8:32 AM 11/29/2020    9:45 AM 05/27/2020    9:35 AM 02/17/2019    9:48 AM 12/25/2018   10:34 AM  Fall Risk   Falls in the past year? 0 0 0 0 0  Number falls in past yr: 0  0 0 0  Injury with Fall? 0  0 0 0  Risk for fall due to : No Fall Risks Medication side effect     Follow up Falls evaluation completed Falls evaluation completed;Education provided;Falls prevention discussed       Depression Screen    07/06/2021    8:32 AM 11/29/2020    9:45 AM 05/27/2020    9:35 AM 02/17/2019    9:49 AM 12/25/2018   10:35 AM  Depression screen PHQ 2/9  Decreased Interest 0 0 0 0 0  Down, Depressed, Hopeless 0 0 0 0 0  PHQ - 2 Score 0 0 0 0 0  Altered sleeping 0   0 0  Tired, decreased energy 0   0 0  Change in appetite 0   0 0  Feeling bad or failure about yourself  0   0 0  Trouble concentrating 0   0 0  Moving slowly or  fidgety/restless 0   0 0  Suicidal thoughts 0   0 0  PHQ-9 Score 0   0 0  Difficult doing work/chores Not difficult at all        Advanced Directives Patient does not have a living will. Would be interested in information.   Past Medical History:  Past Medical History:  Diagnosis Date   History of prostatitis    Hyperlipidemia    Low testosterone     Surgical History:  Past Surgical History:  Procedure Laterality Date   BICEPT TENODESIS Right 04/12/2015   Procedure: OPEN BICEPS TENODESIS;  Surgeon: Tania Ade, MD;  Location: [facility];  Service: Orthopedics;  Laterality: Right;   COLONOSCOPY W/ BIOPSIES     SHOULDER ARTHROSCOPY WITH DISTAL CLAVICLE RESECTION Right 04/12/2015   Procedure: SHOULDER ARTHROSCOPY WITH DISTAL CLAVICLE RESECTION;  Surgeon: Tania Ade, MD;  Location: [facility];  Service: Orthopedics;  Laterality: Right;   SHOULDER ARTHROSCOPY WITH SUBACROMIAL DECOMPRESSION Right 04/12/2015   Procedure: SHOULDER ARTHROSCOPY WITH SUBACROMIAL DECOMPRESSION DEBRIDEMENT LABRAL TEAR;  Surgeon: Tania Ade, MD;  Location: [facility];  Service: Orthopedics;  Laterality: Right;    Medications:  Current  Outpatient Medications on File Prior to Visit  Medication Sig   amitriptyline (ELAVIL) 25 MG tablet Take 1 tablet (25 mg total) by mouth at bedtime.   aspirin EC 81 MG tablet Take 81 mg by mouth daily. Swallow whole.   calcium carbonate (TUMS EX) 750 MG chewable tablet Chew 1 tablet by mouth daily.   Cholecalciferol (VITAMIN D3) 125 MCG (5000 UT) TABS Take 125 Units by mouth daily.   Omega-3 Fatty Acids (FISH OIL) 1200 MG CAPS Take 1,200 mg by mouth daily.   vitamin B-12 (CYANOCOBALAMIN) 500 MCG tablet Take 500 mcg by mouth daily.   No current facility-administered medications on file prior to visit.    Allergies:  Allergies  Allergen Reactions   Alfuzosin     Dizziness     Social History:  Social History    Socioeconomic History   Marital status: Married    Spouse name: Not on file   Number of children: Not on file   Years of education: Not on file   Highest education level: Not on file  Occupational History   Not on file  Tobacco Use   Smoking status: Former    Types: Cigarettes    Quit date: 01/11/1990    Years since quitting: 32.0   Smokeless tobacco: Never  Vaping Use   Vaping Use: Never used  Substance and Sexual Activity   Alcohol use: Yes    Comment: socially   Drug use: No   Sexual activity: Not on file  Other Topics Concern   Not on file  Social History Narrative   Not on file   Social Determinants of Health   Financial Resource Strain: Low Risk  (11/29/2020)   Overall Financial Resource Strain (CARDIA)    Difficulty of Paying Living Expenses: Not hard at all  Food Insecurity: No Food Insecurity (11/29/2020)   Hunger Vital Sign    Worried About Running Out of Food in the Last Year: Never true    [place] in the Last Year: Never true  Transportation Needs: No Transportation Needs (11/29/2020)   PRAPARE - Hydrologist (Medical): No    Lack of Transportation (Non-Medical): No  Physical Activity: Inactive (11/29/2020)   Exercise Vital Sign    Days of Exercise per Week: 0 days    Minutes of Exercise per Session: 0 min  Stress: Stress Concern Present (11/29/2020)   [place]    Feeling of Stress : To some extent  Social Connections: Not on file  Intimate Partner Violence: Not on file   Social History   Tobacco Use  Smoking Status Former   Types: Cigarettes   Quit date: 01/11/1990   Years since quitting: 32.0  Smokeless Tobacco Never   Social History   Substance and Sexual Activity  Alcohol Use Yes   Comment: socially    Family History:  Family History  Problem Relation Age of Onset   Hypertension Mother    Diabetes Maternal Aunt    Hearing loss  Paternal Uncle    Stroke Paternal Uncle    Cancer Paternal Grandfather        prostate   COPD Neg Hx     Past medical history, surgical history, medications, allergies, family history and social history reviewed with patient today and changes made to appropriate areas of the chart.   Review of Systems  Eyes:  Negative for blurred vision and double vision.  Respiratory:  Negative for shortness of breath.   Cardiovascular:  Negative for chest pain, palpitations and leg swelling.  Neurological:  Negative for dizziness and headaches.   All other ROS negative except what is listed above and in the HPI.      Objective:    BP 126/77   Pulse 85   Temp 98.2 F (36.8 C) (Oral)   Wt 191 lb 14.4 oz (87 kg)   SpO2 95%   BMI 28.34 kg/m   Wt Readings from Last 3 Encounters:  01/09/22 191 lb 14.4 oz (87 kg)  12/29/21 188 lb (85.3 kg)  11/09/21 184 lb 3.2 oz (83.6 kg)    No results found.  Physical Exam Vitals and nursing note reviewed.  Constitutional:      General: He is not in acute distress.    Appearance: Normal appearance. He is not ill-appearing, toxic-appearing or diaphoretic.  HENT:     Head: Normocephalic.     Right Ear: Tympanic membrane, ear canal and external ear normal.     Left Ear: Tympanic membrane, ear canal and external ear normal.     Nose: Nose normal. No congestion or rhinorrhea.     Mouth/Throat:     Mouth: Mucous membranes are moist.  Eyes:     General:        Right eye: No discharge.        Left eye: No discharge.     Extraocular Movements: Extraocular movements intact.     Conjunctiva/sclera: Conjunctivae normal.     Pupils: Pupils are equal, round, and reactive to light.  Cardiovascular:     Rate and Rhythm: Normal rate and regular rhythm.     Heart sounds: No murmur heard. Pulmonary:     Effort: Pulmonary effort is normal. No respiratory distress.     Breath sounds: Normal breath sounds. No wheezing, rhonchi or rales.  Abdominal:     General:  Abdomen is flat. Bowel sounds are normal. There is no distension.     Palpations: Abdomen is soft.     Tenderness: There is no abdominal tenderness. There is no guarding.  Musculoskeletal:     Cervical back: Normal range of motion and neck supple.  Skin:    General: Skin is warm and dry.     Capillary Refill: Capillary refill takes less than 2 seconds.  Neurological:     General: No focal deficit present.     Mental Status: He is alert and oriented to person, place, and time.     Cranial Nerves: No cranial nerve deficit.     Motor: No weakness.     Deep Tendon Reflexes: Reflexes normal.  Psychiatric:        Mood and Affect: Mood normal.        Behavior: Behavior normal.        Thought Content: Thought content normal.        Judgment: Judgment normal.        11/29/2020    9:46 AM  6CIT Screen  What Year? 0 points  What month? 0 points  What time? 0 points  Count back from 20 0 points  Months in reverse 2 points  Repeat phrase 4 points  Total Score 6 points    Cognitive Testing - 6-CIT  Correct? Score   What year is it? yes 0 Yes = 0    No = 4  What month is it? yes 0 Yes = 0    No = 3  Remember:  [place], [address], [place]     What time is it? yes 0 Yes = 0    No = 3  Count backwards from 20 to 1 yes 0 Correct = 0    1 error = 2   More than 1 error = 4  Say the months of the year in reverse. yes 0 Correct = 0    1 error = 2   More than 1 error = 4  What address did I ask you to remember? yes 0 Correct = 0  1 error = 2    2 error = 4    3 error = 6    4 error = 8    All wrong = 10       TOTAL SCORE  0/28   Interpretation:  Normal  Normal (0-7) Abnormal (8-28)    Results for orders placed or performed in visit on 12/29/21  Microscopic Examination   Urine  Result Value Ref Range   WBC, UA 0-5 0 - 5 /hpf   RBC, Urine 0-2 0 - 2 /hpf   Epithelial Cells (non renal) 0-10 0 - 10 /hpf   Bacteria, UA Few None seen/Few  Urinalysis, Complete  Result Value Ref  Range   Specific Gravity, UA 1.020 1.005 - 1.030   pH, UA 5.0 5.0 - 7.5   Color, UA Yellow Yellow   Appearance Ur Clear Clear   Leukocytes,UA Negative Negative   Protein,UA Negative Negative/Trace   Glucose, UA Negative Negative   Ketones, UA Negative Negative   RBC, UA Negative Negative   Bilirubin, UA Negative Negative   Urobilinogen, Ur 0.2 0.2 - 1.0 mg/dL   Nitrite, UA Negative Negative   Microscopic Examination See below:   Bladder Scan (Post Void Residual) in office  Result Value Ref Range   Scan Result 6       Assessment & Plan:   Problem List Items Addressed This Visit       Cardiovascular and Mediastinum   Essential hypertension    Chronic.  Controlled.  Continue with current medication regimen of HCTZ.  Refills sent today.  Labs ordered today.  Return to clinic in 6 months for reevaluation.  Call sooner if concerns arise.        Relevant Medications   aspirin EC 81 MG tablet   atorvastatin (LIPITOR) 20 MG tablet   hydrochlorothiazide (HYDRODIURIL) 12.5 MG tablet   Other Relevant Orders   Comp Met (CMET)     Other   Hyperlipidemia    Chronic.  Controlled.  Continue with current medication regimen of Atorvastatin 48m.  Refill sent today.  Labs ordered today.  Return to clinic in 6 months for reevaluation.  Call sooner if concerns arise.        Relevant Medications   aspirin EC 81 MG tablet   atorvastatin (LIPITOR) 20 MG tablet   hydrochlorothiazide (HYDRODIURIL) 12.5 MG tablet   Other Relevant Orders   Lipid Profile   Anxiety    Chronic.  Controlled without medication.  Labs ordered today.  Return to clinic in 6 months for reevaluation.  Call sooner if concerns arise.        Advanced care planning/counseling discussion    A voluntary discussion about advance care planning including the explanation and discussion of advance directives was extensively discussed  with the patient for 10 minutes with patient.  Explanation about the health care proxy and  Living will was reviewed and packet with  forms with explanation of how to fill them out was given.  The patient plans to fill out the information.  Requested that he bring it into the office once it is complete.  Patient was offered a separate [place] visit for further assistance with forms.          Other Visit Diagnoses     Encounter for annual wellness exam in Medicare patient    -  Primary   Elevated glucose       Relevant Orders   HgB A1c   Elevated PSA       Followed by Urology. PSA done today.  Continue to follow Urology's recommendations.   Relevant Orders   PSA   Need for influenza vaccination       Relevant Orders   Flu Vaccine QUAD High Dose(Fluad) (Completed)        Preventative Services:  Health Risk Assessment and Personalized Prevention Plan: Reviewed during visit Bone Mass Measurements: NA CVD Screening: Up to date Colon Cancer Screening: Up to date Depression Screening: Done today Diabetes Screening: Up to date Glaucoma Screening: Eye exam Hepatitis B vaccine: NA Hepatitis C screening: Up to date HIV Screening: Up to date Flu Vaccine: Done today Lung cancer Screening: NA Obesity Screening: Done today Pneumonia Vaccines (2): Up to date STI Screening: NA PSA screening: Done today  Discussed aspirin prophylaxis for myocardial infarction prevention and decision was made to continue ASA  LABORATORY TESTING:  Health maintenance labs ordered today as discussed above.   The natural history of prostate cancer and ongoing controversy regarding screening and potential treatment outcomes of prostate cancer has been discussed with the patient. The meaning of a false positive PSA and a false negative PSA has been discussed. He indicates understanding of the limitations of this screening test and wishes to proceed with screening PSA testing.   IMMUNIZATIONS:   - Tdap: Tetanus vaccination status reviewed: last tetanus booster within 10 years. -  Influenza: Up to date - Pneumovax: Up to date - Prevnar: Up to date - Zostavax vaccine:  Discussed at visit today  SCREENING: - Colonoscopy: Up to date  Discussed with patient purpose of the colonoscopy is to detect colon cancer at curable precancerous or early stages   - AAA Screening: Not applicable  -Hearing Test: Not applicable  -Spirometry: Not applicable   PATIENT COUNSELING:    Sexuality: Discussed sexually transmitted diseases, partner selection, use of condoms, avoidance of unintended pregnancy  and contraceptive alternatives.   Advised to avoid cigarette smoking.  I discussed with the patient that most people either abstain from alcohol or drink within safe limits (<=14/week and <=4 drinks/occasion for males, <=7/weeks and <= 3 drinks/occasion for females) and that the risk for alcohol disorders and other health effects rises proportionally with the number of drinks per week and how often a drinker exceeds daily limits.  Discussed cessation/primary prevention of drug use and availability of treatment for abuse.   Diet: Encouraged to adjust caloric intake to maintain  or achieve ideal body weight, to reduce intake of dietary saturated fat and total fat, to limit sodium intake by avoiding high sodium foods and not adding table salt, and to maintain adequate dietary potassium and calcium preferably from fresh fruits, vegetables, and low-fat dairy products.    stressed the importance of regular exercise  Injury prevention: Discussed safety belts, safety helmets, smoke detector, smoking near bedding or upholstery.   Dental health: Discussed importance of regular tooth brushing, flossing, and dental  visits.   Follow up plan: NEXT PREVENTATIVE PHYSICAL DUE IN 1 YEAR. Return in about 6 months (around 07/10/2022) for Physical and Fasting labs.

## 2022-01-06 ENCOUNTER — Ambulatory Visit: Payer: Medicare HMO | Admitting: Nurse Practitioner

## 2022-01-09 ENCOUNTER — Encounter: Payer: Self-pay | Admitting: Nurse Practitioner

## 2022-01-09 ENCOUNTER — Ambulatory Visit (INDEPENDENT_AMBULATORY_CARE_PROVIDER_SITE_OTHER): Payer: Medicare HMO | Admitting: Nurse Practitioner

## 2022-01-09 VITALS — BP 126/77 | HR 85 | Temp 98.2°F | Wt 191.9 lb

## 2022-01-09 DIAGNOSIS — Z23 Encounter for immunization: Secondary | ICD-10-CM

## 2022-01-09 DIAGNOSIS — Z7189 Other specified counseling: Secondary | ICD-10-CM

## 2022-01-09 DIAGNOSIS — F419 Anxiety disorder, unspecified: Secondary | ICD-10-CM

## 2022-01-09 DIAGNOSIS — R69 Illness, unspecified: Secondary | ICD-10-CM | POA: Diagnosis not present

## 2022-01-09 DIAGNOSIS — Z0000 Encounter for general adult medical examination without abnormal findings: Secondary | ICD-10-CM | POA: Diagnosis not present

## 2022-01-09 DIAGNOSIS — R7309 Other abnormal glucose: Secondary | ICD-10-CM

## 2022-01-09 DIAGNOSIS — I10 Essential (primary) hypertension: Secondary | ICD-10-CM

## 2022-01-09 DIAGNOSIS — R972 Elevated prostate specific antigen [PSA]: Secondary | ICD-10-CM

## 2022-01-09 DIAGNOSIS — E782 Mixed hyperlipidemia: Secondary | ICD-10-CM

## 2022-01-09 DIAGNOSIS — Z87891 Personal history of nicotine dependence: Secondary | ICD-10-CM | POA: Diagnosis not present

## 2022-01-09 MED ORDER — ATORVASTATIN CALCIUM 20 MG PO TABS: 20.0000 mg | ORAL_TABLET | Freq: Every day | ORAL | 1 refills | Status: DC

## 2022-01-09 MED ORDER — HYDROCHLOROTHIAZIDE 12.5 MG PO TABS: 12.5000 mg | ORAL_TABLET | Freq: Every day | ORAL | 1 refills | Status: DC

## 2022-01-09 NOTE — Assessment & Plan Note (Signed)
Chronic.  Controlled without medication..  Labs ordered today.  Return to clinic in 6 months for reevaluation.  Call sooner if concerns arise.  ° °

## 2022-01-09 NOTE — Assessment & Plan Note (Signed)
Chronic.  Controlled.  Continue with current medication regimen of HCTZ.  Refills sent today.  Labs ordered today.  Return to clinic in 6 months for reevaluation.  Call sooner if concerns arise.

## 2022-01-09 NOTE — Assessment & Plan Note (Signed)
Chronic.  Controlled.  Continue with current medication regimen of Atorvastatin 20mg.  Refill sent today.  Labs ordered today.  Return to clinic in 6 months for reevaluation.  Call sooner if concerns arise.   

## 2022-01-09 NOTE — Assessment & Plan Note (Signed)
A voluntary discussion about advance care planning including the explanation and discussion of advance directives was extensively discussed  with the patient for 10 minutes with patient.  Explanation about the health care proxy and Living will was reviewed and packet with forms with explanation of how to fill them out was given.  The patient plans to fill out the information.  Requested that he bring it into the office once it is complete.  Patient was offered a separate Advance Care Planning visit for further assistance with forms.

## 2022-01-10 LAB — COMPREHENSIVE METABOLIC PANEL
ALT: 29 IU/L (ref 0–44)
AST: 23 IU/L (ref 0–40)
Albumin/Globulin Ratio: 2.1 (ref 1.2–2.2)
Albumin: 4.5 g/dL (ref 3.9–4.9)
Alkaline Phosphatase: 108 IU/L (ref 44–121)
BUN/Creatinine Ratio: 12 (ref 10–24)
BUN: 11 mg/dL (ref 8–27)
Bilirubin Total: 0.3 mg/dL (ref 0.0–1.2)
CO2: 24 mmol/L (ref 20–29)
Calcium: 9.5 mg/dL (ref 8.6–10.2)
Chloride: 102 mmol/L (ref 96–106)
Creatinine, Ser: 0.94 mg/dL (ref 0.76–1.27)
Globulin, Total: 2.1 g/dL (ref 1.5–4.5)
Glucose: 97 mg/dL (ref 70–99)
Potassium: 4.3 mmol/L (ref 3.5–5.2)
Sodium: 140 mmol/L (ref 134–144)
Total Protein: 6.6 g/dL (ref 6.0–8.5)
eGFR: 88 mL/min/{1.73_m2} (ref >59)

## 2022-01-10 LAB — PSA: Prostate Specific Ag, Serum: 1.5 ng/mL (ref 0.0–4.0)

## 2022-01-10 LAB — LIPID PANEL
Chol/HDL Ratio: 5.4 ratio — ABNORMAL HIGH (ref 0.0–5.0)
Cholesterol, Total: 215 mg/dL — ABNORMAL HIGH (ref 100–199)
HDL: 40 mg/dL (ref >39)
LDL Chol Calc (NIH): 141 mg/dL — ABNORMAL HIGH (ref 0–99)
Triglycerides: 189 mg/dL — ABNORMAL HIGH (ref 0–149)
VLDL Cholesterol Cal: 34 mg/dL (ref 5–40)

## 2022-01-10 LAB — HEMOGLOBIN A1C
Est. average glucose Bld gHb Est-mCnc: 126 mg/dL
Hgb A1c MFr Bld: 6 % — ABNORMAL HIGH (ref 4.8–5.6)

## 2022-01-10 NOTE — Progress Notes (Signed)
Please let patient know that his lab work looks good.  His cholesterol is elevated from prior.  I recommend continuing with atorvastatin, following a low fat diet and exercising.  A1c is well controlled at 6.0.  PSA is 1.5.  No other concerns at this time.  Follow up as discussed.

## 2022-01-21 ENCOUNTER — Other Ambulatory Visit: Payer: Self-pay | Admitting: Urology

## 2022-02-01 ENCOUNTER — Ambulatory Visit
Admission: RE | Admit: 2022-02-01 | Discharge: 2022-02-01 | Disposition: A | Discharge status: Home / Self Care | Payer: Medicare HMO | Source: Ambulatory Visit | Attending: Urology | Admitting: Urology

## 2022-02-01 DIAGNOSIS — R102 Pelvic and perineal pain: Secondary | ICD-10-CM | POA: Diagnosis not present

## 2022-02-01 DIAGNOSIS — I700 Atherosclerosis of aorta: Secondary | ICD-10-CM | POA: Diagnosis not present

## 2022-02-01 DIAGNOSIS — N411 Chronic prostatitis: Secondary | ICD-10-CM | POA: Diagnosis not present

## 2022-02-01 DIAGNOSIS — G894 Chronic pain syndrome: Secondary | ICD-10-CM | POA: Insufficient documentation

## 2022-02-01 DIAGNOSIS — G8929 Other chronic pain: Secondary | ICD-10-CM | POA: Diagnosis not present

## 2022-02-01 DIAGNOSIS — K4090 Unilateral inguinal hernia, without obstruction or gangrene, not specified as recurrent: Secondary | ICD-10-CM | POA: Insufficient documentation

## 2022-02-01 DIAGNOSIS — M5137 Other intervertebral disc degeneration, lumbosacral region: Secondary | ICD-10-CM | POA: Diagnosis not present

## 2022-02-03 ENCOUNTER — Telehealth: Payer: Self-pay | Admitting: *Deleted

## 2022-02-03 NOTE — Telephone Encounter (Signed)
Notified patient as instructed, patient pleased. Discussed follow-up appointments, patient agrees  

## 2022-02-03 NOTE — Telephone Encounter (Signed)
-----   Message from [name], MD sent at 02/03/2022  7:26 AM EST ----- CT scan showed no abnormalities.  There is a small left inguinal hernia which has been noted previously and would not be causing the symptoms

## 2022-02-08 DIAGNOSIS — Z03818 Encounter for observation for suspected exposure to other biological agents ruled out: Secondary | ICD-10-CM | POA: Diagnosis not present

## 2022-02-08 DIAGNOSIS — J019 Acute sinusitis, unspecified: Secondary | ICD-10-CM | POA: Diagnosis not present

## 2022-02-08 DIAGNOSIS — B9689 Other specified bacterial agents as the cause of diseases classified elsewhere: Secondary | ICD-10-CM | POA: Diagnosis not present

## 2022-02-09 ENCOUNTER — Ambulatory Visit: Payer: Medicare HMO | Admitting: Urology

## 2022-02-21 ENCOUNTER — Other Ambulatory Visit: Payer: Self-pay | Admitting: Urology

## 2022-03-08 ENCOUNTER — Ambulatory Visit: Payer: Medicare HMO | Admitting: Urology

## 2022-03-08 ENCOUNTER — Encounter: Payer: Self-pay | Admitting: Urology

## 2022-03-08 VITALS — BP 134/79 | HR 137 | Ht 70.0 in | Wt 192.0 lb

## 2022-03-08 DIAGNOSIS — N411 Chronic prostatitis: Secondary | ICD-10-CM

## 2022-03-08 DIAGNOSIS — G894 Chronic pain syndrome: Secondary | ICD-10-CM

## 2022-03-08 DIAGNOSIS — N401 Enlarged prostate with lower urinary tract symptoms: Secondary | ICD-10-CM

## 2022-03-08 LAB — MICROSCOPIC EXAMINATION: Bacteria, UA: NONE SEEN

## 2022-03-08 LAB — URINALYSIS, COMPLETE
Bilirubin, UA: NEGATIVE
Ketones, UA: NEGATIVE
Leukocytes,UA: NEGATIVE
Nitrite, UA: NEGATIVE
Protein,UA: NEGATIVE
RBC, UA: NEGATIVE
Specific Gravity, UA: 1.015 (ref 1.005–1.030)
Urobilinogen, Ur: 0.2 mg/dL (ref 0.2–1.0)
pH, UA: 5 (ref 5.0–7.5)

## 2022-03-08 NOTE — Progress Notes (Signed)
03/08/2022 2:31 PM   ALEM FAHL 11/09/1950 [id number]  Referring provider: Jon Billings, NP [address] [place],  [place] [address]  Chief Complaint  Patient presents with   Benign Prostatic Hypertrophy    HPI: 72 y.o. male presents for follow-up visit.  Refer to my previous note 12/29/2020 for a clinical summary Cystoscopy December 2022 showed moderate prostate enlargement and prostate volume was 34 g on TRUS He has been unable to tolerate tamsulosin, alfuzosin or silodosin secondary to side effects Presently complains of suprapubic discomfort and sensation of incomplete emptying. His voiding symptoms are significantly worse for 2-3 days after ejaculation  At last visit 12/29/2021 he was given a trial of amitriptyline 25 mg at bedtime.  He has noted marked improvement in his symptoms and no significant amitriptyline side effects. Pelvic CT showed a small left inguinal hernia   PMH: Past Medical History:  Diagnosis Date   History of prostatitis    Hyperlipidemia    Low testosterone     Surgical History: Past Surgical History:  Procedure Laterality Date   BICEPT TENODESIS Right 04/12/2015   Procedure: OPEN BICEPS TENODESIS;  Surgeon: Tania Ade, MD;  Location: [facility];  Service: Orthopedics;  Laterality: Right;   COLONOSCOPY W/ BIOPSIES     SHOULDER ARTHROSCOPY WITH DISTAL CLAVICLE RESECTION Right 04/12/2015   Procedure: SHOULDER ARTHROSCOPY WITH DISTAL CLAVICLE RESECTION;  Surgeon: Tania Ade, MD;  Location: [facility];  Service: Orthopedics;  Laterality: Right;   SHOULDER ARTHROSCOPY WITH SUBACROMIAL DECOMPRESSION Right 04/12/2015   Procedure: SHOULDER ARTHROSCOPY WITH SUBACROMIAL DECOMPRESSION DEBRIDEMENT LABRAL TEAR;  Surgeon: Tania Ade, MD;  Location: [facility];  Service: Orthopedics;  Laterality: Right;    Home Medications:  Allergies as of 03/08/2022       Reactions   Alfuzosin    Dizziness          Medication List        Accurate as of March 08, 2022  2:31 PM. If you have any questions, ask your nurse or doctor.          amitriptyline 25 MG tablet Commonly known as: ELAVIL TAKE 1 TABLET BY MOUTH EVERYDAY AT BEDTIME   aspirin EC 81 MG tablet Take 81 mg by mouth daily. Swallow whole.   atorvastatin 20 MG tablet Commonly known as: LIPITOR Take 1 tablet (20 mg total) by mouth daily.   calcium carbonate 750 MG chewable tablet Commonly known as: TUMS EX Chew 1 tablet by mouth daily.   Fish Oil 1200 MG Caps Take 1,200 mg by mouth daily.   hydrochlorothiazide 12.5 MG tablet Commonly known as: HYDRODIURIL Take 1 tablet (12.5 mg total) by mouth daily.   vitamin B-12 500 MCG tablet Commonly known as: CYANOCOBALAMIN Take 500 mcg by mouth daily.   Vitamin D3 125 MCG (5000 UT) Tabs Take 125 Units by mouth daily.        Allergies:  Allergies  Allergen Reactions   Alfuzosin     Dizziness     Family History: Family History  Problem Relation Age of Onset   Hypertension Mother    Diabetes Maternal Aunt    Hearing loss Paternal Uncle    Stroke Paternal Uncle    Cancer Paternal Grandfather        prostate   COPD Neg Hx     Social History:  reports that he quit smoking about 32 years ago. His smoking use included cigarettes. He has never used smokeless tobacco.  He reports current alcohol use. He reports that he does not use drugs.   Physical Exam: There were no vitals taken for this visit.  Constitutional:  Alert and oriented, No acute distress. HEENT: [place] AT Respiratory: Normal respiratory effort, no increased work of breathing. GU: Prostate 40 g, smooth without nodules.  Mild-moderate prostatic tenderness and pelvic floor tenderness Psychiatric: Normal mood and affect.  Laboratory Data:  Urinalysis Dipstick/microscopy negative   Assessment & Plan:    1.  Chronic prostatitis/chronic pelvic pain syndrome Significant improvement in  symptoms on amitriptyline which he will continue Annual follow-up and instructed to call earlier for any worsening symptoms   Abbie Sons, MD  [facility] [address] [place], [place] [address] [phone]

## 2022-03-09 ENCOUNTER — Encounter: Payer: Self-pay | Admitting: Urology

## 2022-04-10 DIAGNOSIS — R7303 Prediabetes: Secondary | ICD-10-CM | POA: Insufficient documentation

## 2022-04-10 NOTE — Progress Notes (Unsigned)
There were no vitals taken for this visit.   Subjective:    Patient ID: Dennis Mclaughlin, male    DOB: 1950/05/22, 72 y.o.   MRN: [id number]  HPI: Dennis Mclaughlin is a 72 y.o. male  No chief complaint on file.  HYPERTENSION / HYPERLIPIDEMIA Satisfied with current treatment? no Duration of hypertension: years BP monitoring frequency: not checking BP range:  BP medication side effects: no Past BP meds: amlodipine Duration of hyperlipidemia: years Cholesterol medication side effects: no Cholesterol supplements: fish oil Past cholesterol medications: atorvastain (lipitor) Medication compliance: excellent compliance Aspirin: yes Recent stressors: no Recurrent headaches: no Visual changes: no Palpitations: no Dyspnea: no Chest pain: no Lower extremity edema: no Dizzy/lightheaded: no    Relevant past medical, surgical, family and social history reviewed and updated as indicated. Interim medical history since our last visit reviewed. Allergies and medications reviewed and updated.  Review of Systems  Eyes:  Negative for visual disturbance.  Respiratory:  Negative for chest tightness and shortness of breath.   Cardiovascular:  Negative for chest pain, palpitations and leg swelling.  Neurological:  Negative for dizziness, light-headedness and headaches.    Per HPI unless specifically indicated above     Objective:    There were no vitals taken for this visit.  Wt Readings from Last 3 Encounters:  03/08/22 192 lb (87.1 kg)  01/09/22 191 lb 14.4 oz (87 kg)  12/29/21 188 lb (85.3 kg)    Physical Exam Vitals and nursing note reviewed.  Constitutional:      General: He is not in acute distress.    Appearance: Normal appearance. He is not ill-appearing, toxic-appearing or diaphoretic.  HENT:     Head: Normocephalic.     Right Ear: External ear normal.     Left Ear: External ear normal.     Nose: Nose normal. No congestion or rhinorrhea.     Mouth/Throat:     Mouth:  Mucous membranes are moist.  Eyes:     General:        Right eye: No discharge.        Left eye: No discharge.     Extraocular Movements: Extraocular movements intact.     Conjunctiva/sclera: Conjunctivae normal.     Pupils: Pupils are equal, round, and reactive to light.  Cardiovascular:     Rate and Rhythm: Normal rate and regular rhythm.     Heart sounds: No murmur heard. Pulmonary:     Effort: Pulmonary effort is normal. No respiratory distress.     Breath sounds: Normal breath sounds. No wheezing, rhonchi or rales.  Abdominal:     General: Abdomen is flat. Bowel sounds are normal.  Musculoskeletal:     Cervical back: Normal range of motion and neck supple.  Skin:    General: Skin is warm and dry.     Capillary Refill: Capillary refill takes less than 2 seconds.  Neurological:     General: No focal deficit present.     Mental Status: He is alert and oriented to person, place, and time.  Psychiatric:        Mood and Affect: Mood normal.        Behavior: Behavior normal.        Thought Content: Thought content normal.        Judgment: Judgment normal.     Results for orders placed or performed in visit on 03/08/22  Microscopic Examination   Urine  Result Value Ref Range   WBC,  UA 0-5 0 - 5 /hpf   RBC, Urine 0-2 0 - 2 /hpf   Epithelial Cells (non renal) 0-10 0 - 10 /hpf   Bacteria, UA None seen None seen/Few  Urinalysis, Complete  Result Value Ref Range   Specific Gravity, UA 1.015 1.005 - 1.030   pH, UA 5.0 5.0 - 7.5   Color, UA Yellow Yellow   Appearance Ur Clear Clear   Leukocytes,UA Negative Negative   Protein,UA Negative Negative/Trace   Glucose, UA 2+ (A) Negative   Ketones, UA Negative Negative   RBC, UA Negative Negative   Bilirubin, UA Negative Negative   Urobilinogen, Ur 0.2 0.2 - 1.0 mg/dL   Nitrite, UA Negative Negative   Microscopic Examination See below:       Assessment & Plan:   Problem List Items Addressed This Visit       Cardiovascular  and Mediastinum   Essential hypertension - Primary     Other   Hyperlipidemia   Anxiety     Follow up plan: No follow-ups on file.

## 2022-04-11 ENCOUNTER — Encounter: Payer: Self-pay | Admitting: Nurse Practitioner

## 2022-04-11 ENCOUNTER — Ambulatory Visit (INDEPENDENT_AMBULATORY_CARE_PROVIDER_SITE_OTHER): Payer: Medicare HMO | Admitting: Nurse Practitioner

## 2022-04-11 VITALS — BP 127/75 | HR 81 | Temp 97.9°F | Wt 194.7 lb

## 2022-04-11 DIAGNOSIS — K219 Gastro-esophageal reflux disease without esophagitis: Secondary | ICD-10-CM | POA: Diagnosis not present

## 2022-04-11 DIAGNOSIS — F419 Anxiety disorder, unspecified: Secondary | ICD-10-CM

## 2022-04-11 DIAGNOSIS — E782 Mixed hyperlipidemia: Secondary | ICD-10-CM | POA: Diagnosis not present

## 2022-04-11 DIAGNOSIS — I10 Essential (primary) hypertension: Secondary | ICD-10-CM

## 2022-04-11 DIAGNOSIS — R7303 Prediabetes: Secondary | ICD-10-CM

## 2022-04-11 MED ORDER — OMEPRAZOLE 20 MG PO CPDR: 20.0000 mg | DELAYED_RELEASE_CAPSULE | Freq: Every day | ORAL | 1 refills | Status: DC

## 2022-04-11 NOTE — Assessment & Plan Note (Signed)
Labs ordered at visit today.  Will make recommendations based on lab results.   

## 2022-04-11 NOTE — Assessment & Plan Note (Signed)
Chronic.  Controlled.  Continue with current medication regimen of Atorvastatin 68m.  Labs ordered today.  Return to clinic in 6 months for reevaluation.  Call sooner if concerns arise.

## 2022-04-11 NOTE — Assessment & Plan Note (Signed)
Chronic. Not well controlled.  Has been using Tums with every meal.  Will start Omeprazole daily.  Discussed side effects and benefits of medication during visit today.

## 2022-04-11 NOTE — Assessment & Plan Note (Signed)
Chronic.  Controlled.  Continue with current medication regimen of HCTZ.  Labs ordered today.  Return to clinic in 6 months for reevaluation.  Call sooner if concerns arise.

## 2022-04-12 LAB — COMPREHENSIVE METABOLIC PANEL
ALT: 42 IU/L (ref 0–44)
AST: 27 IU/L (ref 0–40)
Albumin/Globulin Ratio: 1.9 (ref 1.2–2.2)
Albumin: 4.4 g/dL (ref 3.9–4.9)
Alkaline Phosphatase: 110 IU/L (ref 44–121)
BUN/Creatinine Ratio: 11 (ref 10–24)
BUN: 11 mg/dL (ref 8–27)
Bilirubin Total: 0.3 mg/dL (ref 0.0–1.2)
CO2: 23 mmol/L (ref 20–29)
Calcium: 10 mg/dL (ref 8.6–10.2)
Chloride: 101 mmol/L (ref 96–106)
Creatinine, Ser: 1 mg/dL (ref 0.76–1.27)
Globulin, Total: 2.3 g/dL (ref 1.5–4.5)
Glucose: 97 mg/dL (ref 70–99)
Potassium: 4.4 mmol/L (ref 3.5–5.2)
Sodium: 139 mmol/L (ref 134–144)
Total Protein: 6.7 g/dL (ref 6.0–8.5)
eGFR: 81 mL/min/{1.73_m2} (ref >59)

## 2022-04-12 LAB — LIPID PANEL
Chol/HDL Ratio: 4.4 ratio (ref 0.0–5.0)
Cholesterol, Total: 193 mg/dL (ref 100–199)
HDL: 44 mg/dL (ref >39)
LDL Chol Calc (NIH): 127 mg/dL — ABNORMAL HIGH (ref 0–99)
Triglycerides: 121 mg/dL (ref 0–149)
VLDL Cholesterol Cal: 22 mg/dL (ref 5–40)

## 2022-04-12 LAB — HEMOGLOBIN A1C
Est. average glucose Bld gHb Est-mCnc: 123 mg/dL
Hgb A1c MFr Bld: 5.9 % — ABNORMAL HIGH (ref 4.8–5.6)

## 2022-04-12 NOTE — Progress Notes (Signed)
Please let patient know that his lab work looks great.  Cholesterol has improved from prior.  A1c is well controlled at 5.9%.  No concerns at this time.  Continue with current medication regimen.

## 2022-05-08 ENCOUNTER — Encounter: Payer: Self-pay | Admitting: Family Medicine

## 2022-05-08 ENCOUNTER — Ambulatory Visit (INDEPENDENT_AMBULATORY_CARE_PROVIDER_SITE_OTHER): Payer: Medicare HMO | Admitting: Family Medicine

## 2022-05-08 VITALS — BP 121/79 | HR 116 | Temp 97.7°F | Ht 70.0 in | Wt 189.7 lb

## 2022-05-08 DIAGNOSIS — R197 Diarrhea, unspecified: Secondary | ICD-10-CM | POA: Diagnosis not present

## 2022-05-08 NOTE — Progress Notes (Signed)
BP 121/79   Pulse (!) 116   Temp 97.7 F (36.5 C) (Oral)   Ht '5\' 10"'$  (1.778 m)   Wt 189 lb 11.2 oz (86 kg)   SpO2 97%   BMI 27.22 kg/m    Subjective:    Patient ID: Dennis Mclaughlin, male    DOB: [birth year], 72 y.o.   MRN: [id number]  HPI: [name] is a 72 y.o. male  Chief Complaint  Patient presents with   Diarrhea    Patient says "everything goes right through him." Patient says he has been taking over the counter [facility] brand diarrhea medication. Patient says his symptom started on Friday and says the medication does not seem to help at all.    ABDOMINAL ISSUES Duration: 4 days Nature: no pain, no cramping Treatments attempted: imodium Constipation: no Diarrhea: yes Episodes of diarrhea/day: 20+ times a day Mucous in the stool: no Heartburn: yes Bloating:yes Flatulence: yes Nausea: no Vomiting: no Episodes of vomit/day: Melena or hematochezia: no Rash: no Jaundice: no Fever: no Weight loss: yes  Relevant past medical, surgical, family and social history reviewed and updated as indicated. Interim medical history since our last visit reviewed. Allergies and medications reviewed and updated.  Review of Systems  Constitutional: Negative.   Respiratory: Negative.    Cardiovascular: Negative.   Gastrointestinal:  Positive for diarrhea. Negative for abdominal distention, abdominal pain, anal bleeding, blood in stool, constipation, nausea, rectal pain and vomiting.  Musculoskeletal: Negative.   Skin: Negative.   Psychiatric/Behavioral: Negative.      Per HPI unless specifically indicated above     Objective:    BP 121/79   Pulse (!) 116   Temp 97.7 F (36.5 C) (Oral)   Ht '5\' 10"'$  (1.778 m)   Wt 189 lb 11.2 oz (86 kg)   SpO2 97%   BMI 27.22 kg/m   Wt Readings from Last 3 Encounters:  05/08/22 189 lb 11.2 oz (86 kg)  04/11/22 194 lb 11.2 oz (88.3 kg)  03/08/22 192 lb (87.1 kg)    Physical Exam Vitals and nursing note reviewed.  Constitutional:       General: He is not in acute distress.    Appearance: Normal appearance. He is not ill-appearing, toxic-appearing or diaphoretic.  HENT:     Head: Normocephalic and atraumatic.     Right Ear: External ear normal.     Left Ear: External ear normal.     Nose: Nose normal.     Mouth/Throat:     Mouth: Mucous membranes are moist.     Pharynx: Oropharynx is clear.  Eyes:     General: No scleral icterus.       Right eye: No discharge.        Left eye: No discharge.     Extraocular Movements: Extraocular movements intact.     Conjunctiva/sclera: Conjunctivae normal.     Pupils: Pupils are equal, round, and reactive to light.  Cardiovascular:     Rate and Rhythm: Normal rate and regular rhythm.     Pulses: Normal pulses.     Heart sounds: Normal heart sounds. No murmur heard.    No friction rub. No gallop.  Pulmonary:     Effort: Pulmonary effort is normal. No respiratory distress.     Breath sounds: Normal breath sounds. No stridor. No wheezing, rhonchi or rales.  Chest:     Chest wall: No tenderness.  Abdominal:     General: Abdomen is flat. Bowel sounds are  increased. There is no distension.     Palpations: Abdomen is soft. There is no mass.     Tenderness: There is no abdominal tenderness. There is no right CVA tenderness, left CVA tenderness, guarding or rebound.     Hernia: No hernia is present.  Musculoskeletal:        General: Normal range of motion.     Cervical back: Normal range of motion and neck supple.  Skin:    General: Skin is warm and dry.     Capillary Refill: Capillary refill takes less than 2 seconds.     Coloration: Skin is not jaundiced or pale.     Findings: No bruising, erythema, lesion or rash.  Neurological:     General: No focal deficit present.     Mental Status: He is alert and oriented to person, place, and time. Mental status is at baseline.  Psychiatric:        Mood and Affect: Mood normal.        Behavior: Behavior normal.        Thought  Content: Thought content normal.        Judgment: Judgment normal.     Results for orders placed or performed in visit on 04/11/22  Comp Met (CMET)  Result Value Ref Range   Glucose 97 70 - 99 mg/dL   BUN 11 8 - 27 mg/dL   Creatinine, Ser 1.00 0.76 - 1.27 mg/dL   eGFR 81 >59 mL/min/1.73   BUN/Creatinine Ratio 11 10 - 24   Sodium 139 134 - 144 mmol/L   Potassium 4.4 3.5 - 5.2 mmol/L   Chloride 101 96 - 106 mmol/L   CO2 23 20 - 29 mmol/L   Calcium 10.0 8.6 - 10.2 mg/dL   Total Protein 6.7 6.0 - 8.5 g/dL   Albumin 4.4 3.9 - 4.9 g/dL   Globulin, Total 2.3 1.5 - 4.5 g/dL   Albumin/Globulin Ratio 1.9 1.2 - 2.2   Bilirubin Total 0.3 0.0 - 1.2 mg/dL   Alkaline Phosphatase 110 44 - 121 IU/L   AST 27 0 - 40 IU/L   ALT 42 0 - 44 IU/L  Lipid Profile  Result Value Ref Range   Cholesterol, Total 193 100 - 199 mg/dL   Triglycerides 121 0 - 149 mg/dL   HDL 44 >39 mg/dL   VLDL Cholesterol Cal 22 5 - 40 mg/dL   LDL Chol Calc (NIH) 127 (H) 0 - 99 mg/dL   Chol/HDL Ratio 4.4 0.0 - 5.0 ratio  HgB A1c  Result Value Ref Range   Hgb A1c MFr Bld 5.9 (H) 4.8 - 5.6 %   Est. average glucose Bld gHb Est-mCnc 123 mg/dL      Assessment & Plan:   Problem List Items Addressed This Visit   None Visit Diagnoses     Diarrhea, unspecified type    -  Primary   Will check labs and stool studies. Await results. BRAT diet. Rest. Avoid imodium. Call with any concerns.   Relevant Orders   CBC with Differential/Platelet   Comprehensive metabolic panel   Lipase   Amylase   Ova and parasite examination   Stool C-Diff Toxin Assay   Stool Culture   Fecal leukocytes   Fecal occult blood, imunochemical([facility])        Follow up plan: Return if symptoms worsen or fail to improve.

## 2022-05-09 ENCOUNTER — Ambulatory Visit: Payer: Self-pay

## 2022-05-09 LAB — COMPREHENSIVE METABOLIC PANEL
ALT: 70 IU/L — ABNORMAL HIGH (ref 0–44)
AST: 39 IU/L (ref 0–40)
Albumin/Globulin Ratio: 1.8 (ref 1.2–2.2)
Albumin: 4.5 g/dL (ref 3.9–4.9)
Alkaline Phosphatase: 124 IU/L — ABNORMAL HIGH (ref 44–121)
BUN/Creatinine Ratio: 13 (ref 10–24)
BUN: 15 mg/dL (ref 8–27)
Bilirubin Total: 0.4 mg/dL (ref 0.0–1.2)
CO2: 17 mmol/L — ABNORMAL LOW (ref 20–29)
Calcium: 9.1 mg/dL (ref 8.6–10.2)
Chloride: 101 mmol/L (ref 96–106)
Creatinine, Ser: 1.12 mg/dL (ref 0.76–1.27)
Globulin, Total: 2.5 g/dL (ref 1.5–4.5)
Glucose: 115 mg/dL — ABNORMAL HIGH (ref 70–99)
Potassium: 4 mmol/L (ref 3.5–5.2)
Sodium: 134 mmol/L (ref 134–144)
Total Protein: 7 g/dL (ref 6.0–8.5)
eGFR: 71 mL/min/{1.73_m2} (ref >59)

## 2022-05-09 LAB — CBC WITH DIFFERENTIAL/PLATELET
Basophils Absolute: 0 10*3/uL (ref 0.0–0.2)
Basos: 0 %
EOS (ABSOLUTE): 0.1 10*3/uL (ref 0.0–0.4)
Eos: 1 %
Hematocrit: 45.4 % (ref 37.5–51.0)
Hemoglobin: 15.3 g/dL (ref 13.0–17.7)
Immature Grans (Abs): 0 10*3/uL (ref 0.0–0.1)
Immature Granulocytes: 1 %
Lymphocytes Absolute: 1.6 10*3/uL (ref 0.7–3.1)
Lymphs: 24 %
MCH: 31.2 pg (ref 26.6–33.0)
MCHC: 33.7 g/dL (ref 31.5–35.7)
MCV: 93 fL (ref 79–97)
Monocytes Absolute: 1 10*3/uL — ABNORMAL HIGH (ref 0.1–0.9)
Monocytes: 15 %
Neutrophils Absolute: 4 10*3/uL (ref 1.4–7.0)
Neutrophils: 59 %
Platelets: 180 10*3/uL (ref 150–450)
RBC: 4.9 x10E6/uL (ref 4.14–5.80)
RDW: 12.7 % (ref 11.6–15.4)
WBC: 6.6 10*3/uL (ref 3.4–10.8)

## 2022-05-09 LAB — LIPASE: Lipase: 16 U/L (ref 13–78)

## 2022-05-09 LAB — AMYLASE: Amylase: 38 U/L (ref 31–110)

## 2022-05-09 NOTE — Telephone Encounter (Signed)
  Chief Complaint: Diarrhea continues - pt wanted update on testing - treatment Symptoms: Diarrhea Frequency:  Pertinent Negatives: Patient denies  Disposition: [] ED /[] Urgent Care (no appt availability in office) / [] Appointment(In office/virtual)/ []  [facility]/ [] Home Care/ [] Refused Recommended Disposition /[] [facility]/ []  Follow-up with PCP Additional Notes: Pt called to see if testing had been completed on stool sample.  Pt is anxious. He states that diarrhea continues and he is having to clean the bed at night d/t uncontrolled diarrhea.  Please update pt asap.     Summary: diarrhea   Patient called in has diarrhea. He brought sample in yesterday and waiting for response back.       Reason for Disposition  [1] Caller requesting NON-URGENT health information AND [2] PCP's office is the best resource  Answer Assessment - Initial Assessment Questions 1. REASON FOR CALL or QUESTION: "What is your reason for calling today?" or "How can I best help you?" or "What question do you have that I can help answer?"     Pt wanted an update on stool testing.  Protocols used: Information Only Call - No Triage-A-AH

## 2022-05-09 NOTE — Telephone Encounter (Signed)
No, results are not back yet.

## 2022-05-10 LAB — FECAL OCCULT BLOOD, IMMUNOCHEMICAL: Fecal Occult Bld: POSITIVE — AB

## 2022-05-10 NOTE — Telephone Encounter (Signed)
OK for PEC/Nurse Triage to give note if patient calls back.  

## 2022-05-10 NOTE — Telephone Encounter (Signed)
Spoke with patient to follow up and make him aware of Dr Durenda Age recommendations. Patient says he does not have any questions at this time. He was just interested in what was wrong. Patient says he is feeling better. Patient was informed that once his lab results return someone would give him a call back. Patient verbalized understanding.

## 2022-05-10 NOTE — Telephone Encounter (Signed)
Left message for patient to make him aware of Dr Durenda Age recommendations. Advised patient to give our office a call back if he has any questions or concerns before results are in.

## 2022-05-12 LAB — STOOL CULTURE: E coli, Shiga toxin Assay: NEGATIVE

## 2022-05-12 LAB — FECAL LEUKOCYTES

## 2022-05-12 LAB — OVA AND PARASITE EXAMINATION

## 2022-05-12 LAB — CLOSTRIDIUM DIFFICILE EIA: C difficile Toxins A+B, EIA: NEGATIVE

## 2022-06-09 DIAGNOSIS — R051 Acute cough: Secondary | ICD-10-CM | POA: Diagnosis not present

## 2022-06-09 DIAGNOSIS — J40 Bronchitis, not specified as acute or chronic: Secondary | ICD-10-CM | POA: Diagnosis not present

## 2022-07-11 ENCOUNTER — Ambulatory Visit (INDEPENDENT_AMBULATORY_CARE_PROVIDER_SITE_OTHER): Payer: Medicare HMO | Admitting: Nurse Practitioner

## 2022-07-11 ENCOUNTER — Encounter: Payer: Self-pay | Admitting: Nurse Practitioner

## 2022-07-11 VITALS — BP 108/69 | HR 80 | Temp 98.1°F | Ht 70.0 in | Wt 192.4 lb

## 2022-07-11 DIAGNOSIS — R7303 Prediabetes: Secondary | ICD-10-CM | POA: Diagnosis not present

## 2022-07-11 DIAGNOSIS — E782 Mixed hyperlipidemia: Secondary | ICD-10-CM

## 2022-07-11 DIAGNOSIS — Z125 Encounter for screening for malignant neoplasm of prostate: Secondary | ICD-10-CM | POA: Diagnosis not present

## 2022-07-11 DIAGNOSIS — I10 Essential (primary) hypertension: Secondary | ICD-10-CM

## 2022-07-11 DIAGNOSIS — R7989 Other specified abnormal findings of blood chemistry: Secondary | ICD-10-CM

## 2022-07-11 DIAGNOSIS — Z0000 Encounter for general adult medical examination without abnormal findings: Secondary | ICD-10-CM | POA: Diagnosis not present

## 2022-07-11 LAB — URINALYSIS, ROUTINE W REFLEX MICROSCOPIC
Bilirubin, UA: NEGATIVE
Glucose, UA: NEGATIVE
Ketones, UA: NEGATIVE
Leukocytes,UA: NEGATIVE
Nitrite, UA: NEGATIVE
Protein,UA: NEGATIVE
RBC, UA: NEGATIVE
Specific Gravity, UA: 1.01 (ref 1.005–1.030)
Urobilinogen, Ur: 0.2 mg/dL (ref 0.2–1.0)
pH, UA: 5.5 (ref 5.0–7.5)

## 2022-07-11 MED ORDER — ATORVASTATIN CALCIUM 20 MG PO TABS: 20.0000 mg | ORAL_TABLET | Freq: Every day | ORAL | 1 refills | Status: DC

## 2022-07-11 MED ORDER — HYDROCHLOROTHIAZIDE 12.5 MG PO TABS
12.5000 mg | ORAL_TABLET | Freq: Every day | ORAL | 1 refills | Status: DC
Start: 2022-07-11 — End: 2023-01-11

## 2022-07-11 NOTE — Assessment & Plan Note (Signed)
Labs ordered at visit today.  Will make recommendations based on lab results.   

## 2022-07-11 NOTE — Assessment & Plan Note (Signed)
Chronic.  Controlled.  Continue with current medication regimen of HCTZ 12.5mg .  Refills sent today.  Labs ordered today.  Return to clinic in 6 months for reevaluation.  Call sooner if concerns arise.

## 2022-07-11 NOTE — Progress Notes (Signed)
BP 108/69   Pulse 80   Temp 98.1 F (36.7 C) (Oral)   Ht 5\' 10"  (1.778 m)   Wt 192 lb 6.4 oz (87.3 kg)   SpO2 97%   BMI 27.61 kg/m    Subjective:    Patient ID: Dennis Mclaughlin, male    DOB: [birth year], 72 y.o.   MRN: [id number]  HPI: Dennis Mclaughlin is a 72 y.o. male presenting on 07/11/2022 for comprehensive medical examination. Current medical complaints include: Patient   He currently lives with: Interim Problems from his last visit: no  Please send results to Dr. Virl Diamond.  HYPERTENSION / HYPERLIPIDEMIA Satisfied with current treatment? yes Duration of hypertension: years BP monitoring frequency: not checking BP range:  BP medication side effects: no Past BP meds: HCTZ Duration of hyperlipidemia: years Cholesterol medication side effects: no Cholesterol supplements: none Past cholesterol medications: atorvastain (lipitor) Medication compliance: excellent compliance Aspirin: no Recent stressors: no Recurrent headaches: no Visual changes: no Palpitations: no Dyspnea: no Chest pain: no Lower extremity edema:no Dizzy/lightheaded: no  MOOD Patient states his mood has been fine.  He was under a lot of stress when his mom was sick but is doing well now.  Denies concerns at visit today.  Denies SI.   Depression Screen done today and results listed below:     07/11/2022    8:23 AM 07/06/2021    8:32 AM 11/29/2020    9:45 AM 05/27/2020    9:35 AM 02/17/2019    9:49 AM  Depression screen PHQ 2/9  Decreased Interest 0 0 0 0 0  Down, Depressed, Hopeless 0 0 0 0 0  PHQ - 2 Score 0 0 0 0 0  Altered sleeping 0 0   0  Tired, decreased energy 0 0   0  Change in appetite 0 0   0  Feeling bad or failure about yourself  0 0   0  Trouble concentrating 0 0   0  Moving slowly or fidgety/restless 0 0   0  Suicidal thoughts 0 0   0  PHQ-9 Score 0 0   0  Difficult doing work/chores Not difficult at all Not difficult at all       The patient does not have a history of falls. I  did complete a risk assessment for falls. A plan of care for falls was documented.   Past Medical History:  Past Medical History:  Diagnosis Date   History of prostatitis    Hyperlipidemia    Low testosterone     Surgical History:  Past Surgical History:  Procedure Laterality Date   BICEPT TENODESIS Right 04/12/2015   Procedure: OPEN BICEPS TENODESIS;  Surgeon: Jones Broom, MD;  Location: [facility];  Service: Orthopedics;  Laterality: Right;   COLONOSCOPY W/ BIOPSIES     SHOULDER ARTHROSCOPY WITH DISTAL CLAVICLE RESECTION Right 04/12/2015   Procedure: SHOULDER ARTHROSCOPY WITH DISTAL CLAVICLE RESECTION;  Surgeon: Jones Broom, MD;  Location: [facility];  Service: Orthopedics;  Laterality: Right;   SHOULDER ARTHROSCOPY WITH SUBACROMIAL DECOMPRESSION Right 04/12/2015   Procedure: SHOULDER ARTHROSCOPY WITH SUBACROMIAL DECOMPRESSION DEBRIDEMENT LABRAL TEAR;  Surgeon: Jones Broom, MD;  Location: [facility];  Service: Orthopedics;  Laterality: Right;    Medications:  Current Outpatient Medications on File Prior to Visit  Medication Sig   amitriptyline (ELAVIL) 25 MG tablet TAKE 1 TABLET BY MOUTH EVERYDAY AT BEDTIME   aspirin EC 81 MG tablet Take 81 mg by  mouth daily. Swallow whole.   calcium carbonate (TUMS EX) 750 MG chewable tablet Chew 1 tablet by mouth daily.   Cholecalciferol (VITAMIN D3) 125 MCG (5000 UT) TABS Take 125 Units by mouth daily.   fluticasone (FLONASE) 50 MCG/ACT nasal spray Place 2 sprays into both nostrils daily.   Omega-3 Fatty Acids (FISH OIL) 1200 MG CAPS Take 1,200 mg by mouth daily.   vitamin B-12 (CYANOCOBALAMIN) 500 MCG tablet Take 500 mcg by mouth daily.   No current facility-administered medications on file prior to visit.    Allergies:  Allergies  Allergen Reactions   Alfuzosin     Dizziness     Social History:  Social History   Socioeconomic History   Marital status: Married    Spouse name:  Not on file   Number of children: Not on file   Years of education: Not on file   Highest education level: Not on file  Occupational History   Not on file  Tobacco Use   Smoking status: Former    Types: Cigarettes    Quit date: 01/11/1990    Years since quitting: 32.5   Smokeless tobacco: Never  Vaping Use   Vaping Use: Never used  Substance and Sexual Activity   Alcohol use: Yes    Comment: socially   Drug use: No   Sexual activity: Not on file  Other Topics Concern   Not on file  Social History Narrative   Not on file   Social Determinants of Health   Financial Resource Strain: Low Risk  (11/29/2020)   Overall Financial Resource Strain (CARDIA)    Difficulty of Paying Living Expenses: Not hard at all  Food Insecurity: No Food Insecurity (11/29/2020)   Hunger Vital Sign    Worried About Running Out of Food in the Last Year: Never true    Ran Out of Food in the Last Year: Never true  Transportation Needs: No Transportation Needs (11/29/2020)   PRAPARE - Administrator, Civil Service (Medical): No    Lack of Transportation (Non-Medical): No  Physical Activity: Inactive (11/29/2020)   Exercise Vital Sign    Days of Exercise per Week: 0 days    Minutes of Exercise per Session: 0 min  Stress: Stress Concern Present (11/29/2020)   [employer] of Occupational Health - Occupational Stress Questionnaire    Feeling of Stress : To some extent  Social Connections: Not on file  Intimate Partner Violence: Not on file   Social History   Tobacco Use  Smoking Status Former   Types: Cigarettes   Quit date: 01/11/1990   Years since quitting: 32.5  Smokeless Tobacco Never   Social History   Substance and Sexual Activity  Alcohol Use Yes   Comment: socially    Family History:  Family History  Problem Relation Age of Onset   Hypertension Mother    Diabetes Maternal Aunt    Hearing loss Paternal Uncle    Stroke Paternal Uncle    Cancer Paternal Grandfather         prostate   COPD Neg Hx     Past medical history, surgical history, medications, allergies, family history and social history reviewed with patient today and changes made to appropriate areas of the chart.   Review of Systems  Eyes:  Negative for blurred vision and double vision.  Respiratory:  Negative for shortness of breath.   Cardiovascular:  Negative for chest pain, palpitations and leg swelling.  Neurological:  Negative for  dizziness and headaches.  Psychiatric/Behavioral:  Negative for depression and suicidal ideas. The patient is not nervous/anxious.    All other ROS negative except what is listed above and in the HPI.      Objective:    BP 108/69   Pulse 80   Temp 98.1 F (36.7 C) (Oral)   Ht 5\' 10"  (1.778 m)   Wt 192 lb 6.4 oz (87.3 kg)   SpO2 97%   BMI 27.61 kg/m   Wt Readings from Last 3 Encounters:  07/11/22 192 lb 6.4 oz (87.3 kg)  05/08/22 189 lb 11.2 oz (86 kg)  04/11/22 194 lb 11.2 oz (88.3 kg)    Physical Exam Vitals and nursing note reviewed.  Constitutional:      General: He is not in acute distress.    Appearance: Normal appearance. He is normal weight. He is not ill-appearing, toxic-appearing or diaphoretic.  HENT:     Head: Normocephalic.     Right Ear: Tympanic membrane, ear canal and external ear normal.     Left Ear: Tympanic membrane, ear canal and external ear normal.     Nose: Nose normal. No congestion or rhinorrhea.     Mouth/Throat:     Mouth: Mucous membranes are moist.  Eyes:     General:        Right eye: No discharge.        Left eye: No discharge.     Extraocular Movements: Extraocular movements intact.     Conjunctiva/sclera: Conjunctivae normal.     Pupils: Pupils are equal, round, and reactive to light.  Cardiovascular:     Rate and Rhythm: Normal rate and regular rhythm.     Heart sounds: No murmur heard. Pulmonary:     Effort: Pulmonary effort is normal. No respiratory distress.     Breath sounds: Normal breath  sounds. No wheezing, rhonchi or rales.  Abdominal:     General: Abdomen is flat. Bowel sounds are normal. There is no distension.     Palpations: Abdomen is soft.     Tenderness: There is no abdominal tenderness. There is no guarding.  Musculoskeletal:     Cervical back: Normal range of motion and neck supple.  Skin:    General: Skin is warm and dry.     Capillary Refill: Capillary refill takes less than 2 seconds.  Neurological:     General: No focal deficit present.     Mental Status: He is alert and oriented to person, place, and time.     Cranial Nerves: No cranial nerve deficit.     Motor: No weakness.     Deep Tendon Reflexes: Reflexes normal.  Psychiatric:        Mood and Affect: Mood normal.        Behavior: Behavior normal.        Thought Content: Thought content normal.        Judgment: Judgment normal.     Results for orders placed or performed in visit on 05/08/22  Fecal occult blood, imunochemical([facility])   Specimen: Stool   ST  Result Value Ref Range   Fecal Occult Bld Positive (A) Negative  Fecal leukocytes   Specimen: Stool   ST     CD- [id number] V  Result Value Ref Range   White Blood Cells (WBC), Stool Final report None Seen   Result 1 Comment   Stool Culture   Specimen: Stool   ST     CD- [id number] V  Result Value Ref Range  Salmonella/Shigella Screen Final report    Stool Culture result 1 ([facility]) Comment    Campylobacter Culture Final report    Stool Culture result 1 ([facility]) Comment    E coli, Shiga toxin Assay Negative Negative  Clostridium difficile EIA   ST     CD- [id number] V  Result Value Ref Range   C difficile Toxins A+B, EIA Negative Negative  Ova and parasite examination   ST     CD- [id number] V  Result Value Ref Range   OVA + PARASITE EXAM Final report    O&P result 1 Comment   CBC with Differential/Platelet  Result Value Ref Range   WBC 6.6 3.4 - 10.8 x10E3/uL   RBC 4.90 4.14 - 5.80 x10E6/uL   Hemoglobin 15.3 13.0 -  17.7 g/dL   Hematocrit 78.2 95.6 - 51.0 %   MCV 93 79 - 97 fL   MCH 31.2 26.6 - 33.0 pg   MCHC 33.7 31.5 - 35.7 g/dL   RDW 21.3 08.6 - 57.8 %   Platelets 180 150 - 450 x10E3/uL   Neutrophils 59 Not Estab. %   Lymphs 24 Not Estab. %   Monocytes 15 Not Estab. %   Eos 1 Not Estab. %   Basos 0 Not Estab. %   Neutrophils Absolute 4.0 1.4 - 7.0 x10E3/uL   Lymphocytes Absolute 1.6 0.7 - 3.1 x10E3/uL   Monocytes Absolute 1.0 (H) 0.1 - 0.9 x10E3/uL   EOS (ABSOLUTE) 0.1 0.0 - 0.4 x10E3/uL   Basophils Absolute 0.0 0.0 - 0.2 x10E3/uL   Immature Granulocytes 1 Not Estab. %   Immature Grans (Abs) 0.0 0.0 - 0.1 x10E3/uL  Comprehensive metabolic panel  Result Value Ref Range   Glucose 115 (H) 70 - 99 mg/dL   BUN 15 8 - 27 mg/dL   Creatinine, Ser 4.69 0.76 - 1.27 mg/dL   eGFR 71 >62 XB/MWU/1.32   BUN/Creatinine Ratio 13 10 - 24   Sodium 134 134 - 144 mmol/L   Potassium 4.0 3.5 - 5.2 mmol/L   Chloride 101 96 - 106 mmol/L   CO2 17 (L) 20 - 29 mmol/L   Calcium 9.1 8.6 - 10.2 mg/dL   Total Protein 7.0 6.0 - 8.5 g/dL   Albumin 4.5 3.9 - 4.9 g/dL   Globulin, Total 2.5 1.5 - 4.5 g/dL   Albumin/Globulin Ratio 1.8 1.2 - 2.2   Bilirubin Total 0.4 0.0 - 1.2 mg/dL   Alkaline Phosphatase 124 (H) 44 - 121 IU/L   AST 39 0 - 40 IU/L   ALT 70 (H) 0 - 44 IU/L  Lipase  Result Value Ref Range   Lipase 16 13 - 78 U/L  Amylase  Result Value Ref Range   Amylase 38 31 - 110 U/L      Assessment & Plan:   Problem List Items Addressed This Visit       Cardiovascular and Mediastinum   Essential hypertension    Chronic.  Controlled.  Continue with current medication regimen of HCTZ 12.5mg .  Refills sent today.  Labs ordered today.  Return to clinic in 6 months for reevaluation.  Call sooner if concerns arise.        Relevant Medications   atorvastatin (LIPITOR) 20 MG tablet   hydrochlorothiazide (HYDRODIURIL) 12.5 MG tablet     Other   Hyperlipidemia    Chronic.  Controlled.  Continue with current  medication regimen of Atorvastatin daily.  Refills sent today.  Labs ordered today.  Return to clinic in 6  months for reevaluation.  Call sooner if concerns arise.        Relevant Medications   atorvastatin (LIPITOR) 20 MG tablet   hydrochlorothiazide (HYDRODIURIL) 12.5 MG tablet   Other Relevant Orders   Lipid panel   Prediabetes    Labs ordered at visit today.  Will make recommendations based on lab results.        Relevant Orders   HgB A1c   Other Visit Diagnoses     Annual physical exam    -  Primary   Health maintenance reviewed during visit today.  Labs ordered.  Vaccines up to date.  Discussed shingles shot.  Colonoscopy up to date.   Relevant Orders   TSH   PSA   Lipid panel   CBC with Differential/Platelet   Comprehensive metabolic panel   Urinalysis, Routine w reflex microscopic        Discussed aspirin prophylaxis for myocardial infarction prevention and decision was it was not indicated  LABORATORY TESTING:  Health maintenance labs ordered today as discussed above.   The natural history of prostate cancer and ongoing controversy regarding screening and potential treatment outcomes of prostate cancer has been discussed with the patient. The meaning of a false positive PSA and a false negative PSA has been discussed. He indicates understanding of the limitations of this screening test and wishes to proceed with screening PSA testing.   IMMUNIZATIONS:   - Tdap: Tetanus vaccination status reviewed: last tetanus booster within 10 years. - Influenza: Postponed to flu season - Pneumovax: Up to date - Prevnar: Up to date - HPV: Not applicable - Shingrix vaccine:  Discussed at visit today.  SCREENING: - Colonoscopy: Up to date  Discussed with patient purpose of the colonoscopy is to detect colon cancer at curable precancerous or early stages   - AAA Screening: Not applicable  -Hearing Test: Not applicable  -Spirometry: Not applicable   PATIENT COUNSELING:     Sexuality: Discussed sexually transmitted diseases, partner selection, use of condoms, avoidance of unintended pregnancy  and contraceptive alternatives.   Advised to avoid cigarette smoking.  I discussed with the patient that most people either abstain from alcohol or drink within safe limits (<=14/week and <=4 drinks/occasion for males, <=7/weeks and <= 3 drinks/occasion for females) and that the risk for alcohol disorders and other health effects rises proportionally with the number of drinks per week and how often a drinker exceeds daily limits.  Discussed cessation/primary prevention of drug use and availability of treatment for abuse.   Diet: Encouraged to adjust caloric intake to maintain  or achieve ideal body weight, to reduce intake of dietary saturated fat and total fat, to limit sodium intake by avoiding high sodium foods and not adding table salt, and to maintain adequate dietary potassium and calcium preferably from fresh fruits, vegetables, and low-fat dairy products.    stressed the importance of regular exercise  Injury prevention: Discussed safety belts, safety helmets, smoke detector, smoking near bedding or upholstery.   Dental health: Discussed importance of regular tooth brushing, flossing, and dental visits.   Follow up plan: NEXT PREVENTATIVE PHYSICAL DUE IN 1 YEAR. Return in about 6 months (around 01/11/2023) for HTN, HLD, DM2 FU.

## 2022-07-11 NOTE — Assessment & Plan Note (Signed)
Chronic.  Controlled.  Continue with current medication regimen of Atorvastatin daily.  Refills sent today.  Labs ordered today.  Return to clinic in 6 months for reevaluation.  Call sooner if concerns arise.  ° °

## 2022-07-12 LAB — CBC WITH DIFFERENTIAL/PLATELET
Basophils Absolute: 0.1 10*3/uL (ref 0.0–0.2)
Basos: 1 %
EOS (ABSOLUTE): 0.1 10*3/uL (ref 0.0–0.4)
Eos: 2 %
Hematocrit: 43.9 % (ref 37.5–51.0)
Hemoglobin: 14.5 g/dL (ref 13.0–17.7)
Immature Grans (Abs): 0 10*3/uL (ref 0.0–0.1)
Immature Granulocytes: 0 %
Lymphocytes Absolute: 2.1 10*3/uL (ref 0.7–3.1)
Lymphs: 31 %
MCH: 30.7 pg (ref 26.6–33.0)
MCHC: 33 g/dL (ref 31.5–35.7)
MCV: 93 fL (ref 79–97)
Monocytes Absolute: 0.6 10*3/uL (ref 0.1–0.9)
Monocytes: 10 %
Neutrophils Absolute: 3.7 10*3/uL (ref 1.4–7.0)
Neutrophils: 56 %
Platelets: 229 10*3/uL (ref 150–450)
RBC: 4.72 x10E6/uL (ref 4.14–5.80)
RDW: 12.8 % (ref 11.6–15.4)
WBC: 6.7 10*3/uL (ref 3.4–10.8)

## 2022-07-12 LAB — COMPREHENSIVE METABOLIC PANEL
ALT: 40 IU/L (ref 0–44)
AST: 29 IU/L (ref 0–40)
Albumin/Globulin Ratio: 1.8 (ref 1.2–2.2)
Albumin: 4.2 g/dL (ref 3.9–4.9)
Alkaline Phosphatase: 113 IU/L (ref 44–121)
BUN/Creatinine Ratio: 12 (ref 10–24)
BUN: 12 mg/dL (ref 8–27)
Bilirubin Total: 0.4 mg/dL (ref 0.0–1.2)
CO2: 22 mmol/L (ref 20–29)
Calcium: 9.3 mg/dL (ref 8.6–10.2)
Chloride: 104 mmol/L (ref 96–106)
Creatinine, Ser: 0.98 mg/dL (ref 0.76–1.27)
Globulin, Total: 2.4 g/dL (ref 1.5–4.5)
Glucose: 95 mg/dL (ref 70–99)
Potassium: 4.5 mmol/L (ref 3.5–5.2)
Sodium: 140 mmol/L (ref 134–144)
Total Protein: 6.6 g/dL (ref 6.0–8.5)
eGFR: 83 mL/min/{1.73_m2} (ref >59)

## 2022-07-12 LAB — TSH: TSH: 5.11 u[IU]/mL — ABNORMAL HIGH (ref 0.450–4.500)

## 2022-07-12 LAB — LIPID PANEL
Chol/HDL Ratio: 4.9 ratio (ref 0.0–5.0)
Cholesterol, Total: 183 mg/dL (ref 100–199)
HDL: 37 mg/dL — ABNORMAL LOW (ref >39)
LDL Chol Calc (NIH): 122 mg/dL — ABNORMAL HIGH (ref 0–99)
Triglycerides: 133 mg/dL (ref 0–149)
VLDL Cholesterol Cal: 24 mg/dL (ref 5–40)

## 2022-07-12 LAB — HEMOGLOBIN A1C
Est. average glucose Bld gHb Est-mCnc: 126 mg/dL
Hgb A1c MFr Bld: 6 % — ABNORMAL HIGH (ref 4.8–5.6)

## 2022-07-12 LAB — PSA: Prostate Specific Ag, Serum: 1.6 ng/mL (ref 0.0–4.0)

## 2022-07-12 NOTE — Progress Notes (Signed)
Patient asked me to send the labs to you.

## 2022-07-12 NOTE — Addendum Note (Signed)
Addended by: Larae Grooms on: 07/12/2022 08:56 AM   Modules accepted: Orders

## 2022-07-12 NOTE — Progress Notes (Signed)
Please let patient know that her lab work looks good.  His thyroid labs are slightly elevated.  I would like him to come back in 6 weeks as a lab visit and have this repeated.  A1c is well controlled at 6.0%.  PSA looks good at 1.6.  No concerns at this time.  Continue with current medication regimen.  Follow up as discussed.

## 2022-08-20 ENCOUNTER — Other Ambulatory Visit: Payer: Self-pay | Admitting: Urology

## 2022-08-22 ENCOUNTER — Other Ambulatory Visit: Payer: Medicare HMO

## 2022-08-22 DIAGNOSIS — R7989 Other specified abnormal findings of blood chemistry: Secondary | ICD-10-CM

## 2022-08-23 LAB — TSH: TSH: 4.59 u[IU]/mL — ABNORMAL HIGH (ref 0.450–4.500)

## 2022-08-23 LAB — T4, FREE: Free T4: 1.01 ng/dL (ref 0.82–1.77)

## 2022-08-23 NOTE — Progress Notes (Signed)
Please let patient know that his lab work looks good.  Continue with current dose of levothyroxine.  Follow up at regularly scheduled appt.

## 2022-10-10 ENCOUNTER — Ambulatory Visit (INDEPENDENT_AMBULATORY_CARE_PROVIDER_SITE_OTHER): Payer: Medicare HMO | Admitting: Physician Assistant

## 2022-10-10 ENCOUNTER — Encounter: Payer: Self-pay | Admitting: Physician Assistant

## 2022-10-10 VITALS — BP 119/73 | HR 73 | Ht 70.0 in | Wt 190.0 lb

## 2022-10-10 DIAGNOSIS — R7303 Prediabetes: Secondary | ICD-10-CM | POA: Diagnosis not present

## 2022-10-10 DIAGNOSIS — I10 Essential (primary) hypertension: Secondary | ICD-10-CM | POA: Diagnosis not present

## 2022-10-10 DIAGNOSIS — R7989 Other specified abnormal findings of blood chemistry: Secondary | ICD-10-CM

## 2022-10-10 DIAGNOSIS — E782 Mixed hyperlipidemia: Secondary | ICD-10-CM | POA: Diagnosis not present

## 2022-10-10 NOTE — Assessment & Plan Note (Signed)
Chronic.  Controlled.  Continue with current medication regimen of Atorvastatin daily.  Refills sent today. Return to clinic in 6 months for reevaluation.  Call sooner if concerns arise.

## 2022-10-10 NOTE — Progress Notes (Signed)
Established Patient Office Visit  Name: Dennis Mclaughlin   MRN: [id number]    DOB: 1950-07-25   Date:10/10/2022  Today's Provider: Jacquelin Hawking, MHS, PA-C Introduced myself to the patient as a PA-C and provided education on APPs in clinical practice.         Subjective  Chief Complaint  Chief Complaint  Patient presents with   Hypertension   Hyperlipidemia    HPI   HYPERTENSION / HYPERLIPIDEMIA Satisfied with current treatment? yes Duration of hypertension: years BP monitoring frequency: not checking BP range:  BP medication side effects: no Past BP meds: hydrochlorothiazide  Duration of hyperlipidemia: years Cholesterol medication side effects: no Cholesterol supplements: fish oil Past cholesterol medications: atorvastain (lipitor) Medication compliance: good compliance Aspirin: yes Recent stressors: no Recurrent headaches: no Visual changes: no Palpitations: no Dyspnea: no Chest pain: no Lower extremity edema: no Dizzy/lightheaded: no  Prediabetes Previous A1c : 6.0  Medications: none  He denies polyuria, polydipsia, polyphagia   Elevated TSH He denies mood changes, changes to skin, hair or nails Denies diarrhea, constipation       Patient Active Problem List   Diagnosis Date Noted   GERD (gastroesophageal reflux disease) 04/11/2022   Prediabetes 04/10/2022   Advanced care planning/counseling discussion 01/09/2022   Grief 11/09/2021   Numbness and tingling of foot 10/10/2021   Anxiety 08/18/2019   Allergic rhinitis 12/25/2018   Essential hypertension 12/25/2018   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   Hyperlipidemia 01/12/2015   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   Low testosterone 01/12/2015    Past Surgical History:  Procedure Laterality Date   BICEPT TENODESIS Right 04/12/2015   Procedure: OPEN BICEPS TENODESIS;  Surgeon: Jones Broom, MD;  Location: [facility];  Service: Orthopedics;  Laterality:  Right;   COLONOSCOPY W/ BIOPSIES     SHOULDER ARTHROSCOPY WITH DISTAL CLAVICLE RESECTION Right 04/12/2015   Procedure: SHOULDER ARTHROSCOPY WITH DISTAL CLAVICLE RESECTION;  Surgeon: Jones Broom, MD;  Location: [facility];  Service: Orthopedics;  Laterality: Right;   SHOULDER ARTHROSCOPY WITH SUBACROMIAL DECOMPRESSION Right 04/12/2015   Procedure: SHOULDER ARTHROSCOPY WITH SUBACROMIAL DECOMPRESSION DEBRIDEMENT LABRAL TEAR;  Surgeon: Jones Broom, MD;  Location: [facility];  Service: Orthopedics;  Laterality: Right;    Family History  Problem Relation Age of Onset   Hypertension Mother    Diabetes Maternal Aunt    Hearing loss Paternal Uncle    Stroke Paternal Uncle    Cancer Paternal Grandfather        prostate   COPD Neg Hx     Social History   Tobacco Use   Smoking status: Former    Current packs/day: 0.00    Types: Cigarettes    Quit date: 01/11/1990    Years since quitting: 32.7   Smokeless tobacco: Never  Substance Use Topics   Alcohol use: Yes    Comment: socially     Current Outpatient Medications:    amitriptyline (ELAVIL) 25 MG tablet, TAKE 1 TABLET BY MOUTH EVERYDAY AT BEDTIME, Disp: 90 tablet, Rfl: 1   aspirin EC 81 MG tablet, Take 81 mg by mouth daily. Swallow whole., Disp: , Rfl:    atorvastatin (LIPITOR) 20 MG tablet, Take 1 tablet (20 mg total) by mouth daily., Disp: 90 tablet, Rfl: 1   calcium carbonate (TUMS EX) 750 MG chewable tablet, Chew 1 tablet by mouth daily., Disp: , Rfl:    Cholecalciferol (VITAMIN D3) 125 MCG (  5000 UT) TABS, Take 125 Units by mouth daily., Disp: , Rfl:    fluticasone (FLONASE) 50 MCG/ACT nasal spray, Place 2 sprays into both nostrils daily., Disp: , Rfl:    hydrochlorothiazide (HYDRODIURIL) 12.5 MG tablet, Take 1 tablet (12.5 mg total) by mouth daily., Disp: 90 tablet, Rfl: 1   Omega-3 Fatty Acids (FISH OIL) 1200 MG CAPS, Take 1,200 mg by mouth daily., Disp: , Rfl:    vitamin B-12 (CYANOCOBALAMIN)  500 MCG tablet, Take 500 mcg by mouth daily., Disp: , Rfl:   Allergies  Allergen Reactions   Alfuzosin     Dizziness     I personally reviewed active problem list, medication list, allergies, health maintenance, notes from last encounter, lab results with the patient/caregiver today.   Review of Systems  Eyes:  Negative for blurred vision and double vision.  Respiratory:  Negative for shortness of breath.   Cardiovascular:  Negative for chest pain, palpitations and leg swelling.  Gastrointestinal:  Negative for constipation, diarrhea, nausea and vomiting.  Genitourinary:  Negative for frequency.  Skin:  Negative for itching and rash.  Neurological:  Negative for dizziness and headaches.  Endo/Heme/Allergies:  Negative for polydipsia.  Psychiatric/Behavioral:  Negative for depression. The patient is not nervous/anxious.       Objective  Vitals:   10/10/22 0906  BP: 119/73  Pulse: 73  SpO2: 96%  Weight: 190 lb (86.2 kg)  Height: 5\' 10"  (1.778 m)    Body mass index is 27.26 kg/m.  Physical Exam Vitals reviewed.  Constitutional:      General: He is awake.     Appearance: Normal appearance. He is well-developed and well-groomed.  HENT:     Head: Normocephalic and atraumatic.  Neck:     Thyroid: No thyroid mass, thyromegaly or thyroid tenderness.  Cardiovascular:     Rate and Rhythm: Normal rate and regular rhythm.     Pulses: Normal pulses.          Radial pulses are 2+ on the right side and 2+ on the left side.     Heart sounds: Normal heart sounds. No murmur heard.    No friction rub. No gallop.  Pulmonary:     Effort: Pulmonary effort is normal.     Breath sounds: Normal breath sounds. No decreased air movement. No decreased breath sounds, wheezing, rhonchi or rales.  Musculoskeletal:     Right lower leg: No edema.     Left lower leg: No edema.  Lymphadenopathy:     Head:     Right side of head: No submental, submandibular or preauricular adenopathy.      Left side of head: No submental, submandibular or preauricular adenopathy.     Cervical:     Right cervical: No superficial or posterior cervical adenopathy.    Left cervical: No superficial or posterior cervical adenopathy.     Upper Body:     Right upper body: No supraclavicular adenopathy.     Left upper body: No supraclavicular adenopathy.  Skin:    General: Skin is warm and dry.  Neurological:     General: No focal deficit present.     Mental Status: He is alert and oriented to person, place, and time.  Psychiatric:        Mood and Affect: Mood normal.        Behavior: Behavior normal. Behavior is cooperative.        Thought Content: Thought content normal.        Judgment:  Judgment normal.      Recent Results (from the past 2160 hour(s))  T4, free     Status: None   Collection Time: 08/22/22  8:20 AM  Result Value Ref Range   Free T4 1.01 0.82 - 1.77 ng/dL  TSH     Status: Abnormal   Collection Time: 08/22/22  8:20 AM  Result Value Ref Range   TSH 4.590 (H) 0.450 - 4.500 uIU/mL     PHQ2/9:    07/11/2022    8:23 AM 07/06/2021    8:32 AM 11/29/2020    9:45 AM 05/27/2020    9:35 AM 02/17/2019    9:49 AM  Depression screen PHQ 2/9  Decreased Interest 0 0 0 0 0  Down, Depressed, Hopeless 0 0 0 0 0  PHQ - 2 Score 0 0 0 0 0  Altered sleeping 0 0   0  Tired, decreased energy 0 0   0  Change in appetite 0 0   0  Feeling bad or failure about yourself  0 0   0  Trouble concentrating 0 0   0  Moving slowly or fidgety/restless 0 0   0  Suicidal thoughts 0 0   0  PHQ-9 Score 0 0   0  Difficult doing work/chores Not difficult at all Not difficult at all         Fall Risk:    04/11/2022    8:05 AM 07/06/2021    8:32 AM 11/29/2020    9:45 AM 05/27/2020    9:35 AM 02/17/2019    9:48 AM  Fall Risk   Falls in the past year? 0 0 0 0 0  Number falls in past yr: 0 0  0 0  Injury with Fall? 0 0  0 0  Risk for fall due to : No Fall Risks No Fall Risks Medication side effect     Follow up Falls evaluation completed Falls evaluation completed Falls evaluation completed;Education provided;Falls prevention discussed        Functional Status Survey:      Assessment & Plan  Problem List Items Addressed This Visit       Cardiovascular and Mediastinum   Essential hypertension - Primary    Chronic, historic condition Appears well controlled at this time on current regimen of hydrochlorothiazide 12.5 mg PO every day Continue current regimen Follow up in 6 months or sooner if concerns arise          Other   Hyperlipidemia    Chronic.  Controlled.  Continue with current medication regimen of Atorvastatin daily.  Refills sent today. Return to clinic in 6 months for reevaluation.  Call sooner if concerns arise.        Prediabetes    Unsure of chronicity  Previous A1c was 6.0 Recheck today- results to dictate further management Follow up in 6 months or sooner if concerns arise        Relevant Orders   HgB A1c   Other Visit Diagnoses     Elevated TSH     Most recent TSH was elevated with normal T4 levels Will repeat thyroid panel today for monitoring- results to dictate further management    Relevant Orders   Thyroid Panel With TSH        No follow-ups on file.   I,  E , PA-C, have reviewed all documentation for this visit. The documentation on 10/10/22 for the exam, diagnosis, procedures, and orders are all accurate and complete.  Jacquelin Hawking, MHS, PA-C [facility]

## 2022-10-10 NOTE — Assessment & Plan Note (Signed)
Unsure of chronicity  Previous A1c was 6.0 Recheck today- results to dictate further management Follow up in 6 months or sooner if concerns arise

## 2022-10-10 NOTE — Assessment & Plan Note (Signed)
Chronic, historic condition Appears well controlled at this time on current regimen of hydrochlorothiazide 12.5 mg PO every day Continue current regimen Follow up in 6 months or sooner if concerns arise

## 2022-10-11 NOTE — Progress Notes (Signed)
Your A1c is still in prediabetic range at 6.2. No changes are recommended at this time We are still waiting on the results of your thyroid testing and will keep you updated once it comes back

## 2022-12-05 ENCOUNTER — Other Ambulatory Visit: Payer: Self-pay | Admitting: Urology

## 2022-12-07 DIAGNOSIS — H25813 Combined forms of age-related cataract, bilateral: Secondary | ICD-10-CM | POA: Diagnosis not present

## 2022-12-07 DIAGNOSIS — H524 Presbyopia: Secondary | ICD-10-CM | POA: Diagnosis not present

## 2023-01-10 NOTE — Progress Notes (Unsigned)
There were no vitals taken for this visit.   Subjective:    Patient ID: Dennis Mclaughlin, male    DOB: 11-Oct-1950, 72 y.o.   MRN: [id number]  HPI: Dennis Mclaughlin is a 72 y.o. male presenting on 01/11/2023 for comprehensive medical examination. Current medical complaints include:none  He currently lives with: Interim Problems from his last visit: no  HYPERTENSION / HYPERLIPIDEMIA Satisfied with current treatment? yes Duration of hypertension: years BP monitoring frequency: not checking BP range:  BP medication side effects: no Past BP meds: HCTZ Duration of hyperlipidemia: years Cholesterol medication side effects: no Cholesterol supplements: none Past cholesterol medications: atorvastain (lipitor) Medication compliance: excellent compliance Aspirin: no Recent stressors: no Recurrent headaches: no Visual changes: no Palpitations: no Dyspnea: no Chest pain: no Lower extremity edema: no Dizzy/lightheaded: no   Functional Status Survey:    FALL RISK:    04/11/2022    8:05 AM 07/06/2021    8:32 AM 11/29/2020    9:45 AM 05/27/2020    9:35 AM 02/17/2019    9:48 AM  Fall Risk   Falls in the past year? 0 0 0 0 0  Number falls in past yr: 0 0  0 0  Injury with Fall? 0 0  0 0  Risk for fall due to : No Fall Risks No Fall Risks Medication side effect    Follow up Falls evaluation completed Falls evaluation completed Falls evaluation completed;Education provided;Falls prevention discussed      Depression Screen    07/11/2022    8:23 AM 07/06/2021    8:32 AM 11/29/2020    9:45 AM 05/27/2020    9:35 AM 02/17/2019    9:49 AM  Depression screen PHQ 2/9  Decreased Interest 0 0 0 0 0  Down, Depressed, Hopeless 0 0 0 0 0  PHQ - 2 Score 0 0 0 0 0  Altered sleeping 0 0   0  Tired, decreased energy 0 0   0  Change in appetite 0 0   0  Feeling bad or failure about yourself  0 0   0  Trouble concentrating 0 0   0  Moving slowly or fidgety/restless 0 0   0  Suicidal thoughts 0 0    0  PHQ-9 Score 0 0   0  Difficult doing work/chores Not difficult at all Not difficult at all       Advanced Directives Patient does not have a living will. Would be interested in information.   Past Medical History:  Past Medical History:  Diagnosis Date   History of prostatitis    Hyperlipidemia    Low testosterone     Surgical History:  Past Surgical History:  Procedure Laterality Date   BICEPT TENODESIS Right 04/12/2015   Procedure: OPEN BICEPS TENODESIS;  Surgeon: Jones Broom, MD;  Location: [facility];  Service: Orthopedics;  Laterality: Right;   COLONOSCOPY W/ BIOPSIES     SHOULDER ARTHROSCOPY WITH DISTAL CLAVICLE RESECTION Right 04/12/2015   Procedure: SHOULDER ARTHROSCOPY WITH DISTAL CLAVICLE RESECTION;  Surgeon: Jones Broom, MD;  Location: [facility];  Service: Orthopedics;  Laterality: Right;   SHOULDER ARTHROSCOPY WITH SUBACROMIAL DECOMPRESSION Right 04/12/2015   Procedure: SHOULDER ARTHROSCOPY WITH SUBACROMIAL DECOMPRESSION DEBRIDEMENT LABRAL TEAR;  Surgeon: Jones Broom, MD;  Location: [facility];  Service: Orthopedics;  Laterality: Right;    Medications:  Current Outpatient Medications on File Prior to Visit  Medication Sig   amitriptyline (ELAVIL) 25 MG  tablet TAKE 1 TABLET BY MOUTH EVERYDAY AT BEDTIME   aspirin EC 81 MG tablet Take 81 mg by mouth daily. Swallow whole.   atorvastatin (LIPITOR) 20 MG tablet Take 1 tablet (20 mg total) by mouth daily.   calcium carbonate (TUMS EX) 750 MG chewable tablet Chew 1 tablet by mouth daily.   Cholecalciferol (VITAMIN D3) 125 MCG (5000 UT) TABS Take 125 Units by mouth daily.   fluticasone (FLONASE) 50 MCG/ACT nasal spray Place 2 sprays into both nostrils daily.   hydrochlorothiazide (HYDRODIURIL) 12.5 MG tablet Take 1 tablet (12.5 mg total) by mouth daily.   Omega-3 Fatty Acids (FISH OIL) 1200 MG CAPS Take 1,200 mg by mouth daily.   vitamin B-12 (CYANOCOBALAMIN) 500 MCG  tablet Take 500 mcg by mouth daily.   No current facility-administered medications on file prior to visit.    Allergies:  Allergies  Allergen Reactions   Alfuzosin     Dizziness     Social History:  Social History   Socioeconomic History   Marital status: Married    Spouse name: Not on file   Number of children: Not on file   Years of education: Not on file   Highest education level: Not on file  Occupational History   Not on file  Tobacco Use   Smoking status: Former    Current packs/day: 0.00    Types: Cigarettes    Quit date: 01/11/1990    Years since quitting: 33.0   Smokeless tobacco: Never  Vaping Use   Vaping status: Never Used  Substance and Sexual Activity   Alcohol use: Yes    Comment: socially   Drug use: No   Sexual activity: Not on file  Other Topics Concern   Not on file  Social History Narrative   Not on file   Social Determinants of Health   Financial Resource Strain: Low Risk  (11/29/2020)   Overall Financial Resource Strain (CARDIA)    Difficulty of Paying Living Expenses: Not hard at all  Food Insecurity: No Food Insecurity (11/29/2020)   Hunger Vital Sign    Worried About Running Out of Food in the Last Year: Never true    Ran Out of Food in the Last Year: Never true  Transportation Needs: No Transportation Needs (11/29/2020)   PRAPARE - Administrator, Civil Service (Medical): No    Lack of Transportation (Non-Medical): No  Physical Activity: Inactive (11/29/2020)   Exercise Vital Sign    Days of Exercise per Week: 0 days    Minutes of Exercise per Session: 0 min  Stress: Stress Concern Present (11/29/2020)   [employer] of Occupational Health - Occupational Stress Questionnaire    Feeling of Stress : To some extent  Social Connections: Not on file  Intimate Partner Violence: Not on file   Social History   Tobacco Use  Smoking Status Former   Current packs/day: 0.00   Types: Cigarettes   Quit date: 01/11/1990    Years since quitting: 33.0  Smokeless Tobacco Never   Social History   Substance and Sexual Activity  Alcohol Use Yes   Comment: socially    Family History:  Family History  Problem Relation Age of Onset   Hypertension Mother    Diabetes Maternal Aunt    Hearing loss Paternal Uncle    Stroke Paternal Uncle    Cancer Paternal Grandfather        prostate   COPD Neg Hx     Past medical  history, surgical history, medications, allergies, family history and social history reviewed with patient today and changes made to appropriate areas of the chart.   Review of Systems  Eyes:  Negative for blurred vision and double vision.  Respiratory:  Negative for shortness of breath.   Cardiovascular:  Negative for chest pain, palpitations and leg swelling.  Neurological:  Negative for dizziness and headaches.   All other ROS negative except what is listed above and in the HPI.      Objective:    There were no vitals taken for this visit.  Wt Readings from Last 3 Encounters:  10/10/22 190 lb (86.2 kg)  07/11/22 192 lb 6.4 oz (87.3 kg)  05/08/22 189 lb 11.2 oz (86 kg)    No results found.  Physical Exam Vitals and nursing note reviewed.  Constitutional:      General: He is not in acute distress.    Appearance: Normal appearance. He is not ill-appearing, toxic-appearing or diaphoretic.  HENT:     Head: Normocephalic.     Right Ear: Tympanic membrane, ear canal and external ear normal.     Left Ear: Tympanic membrane, ear canal and external ear normal.     Nose: Nose normal. No congestion or rhinorrhea.     Mouth/Throat:     Mouth: Mucous membranes are moist.  Eyes:     General:        Right eye: No discharge.        Left eye: No discharge.     Extraocular Movements: Extraocular movements intact.     Conjunctiva/sclera: Conjunctivae normal.     Pupils: Pupils are equal, round, and reactive to light.  Cardiovascular:     Rate and Rhythm: Normal rate and regular rhythm.      Heart sounds: No murmur heard. Pulmonary:     Effort: Pulmonary effort is normal. No respiratory distress.     Breath sounds: Normal breath sounds. No wheezing, rhonchi or rales.  Abdominal:     General: Abdomen is flat. Bowel sounds are normal. There is no distension.     Palpations: Abdomen is soft.     Tenderness: There is no abdominal tenderness. There is no guarding.  Musculoskeletal:     Cervical back: Normal range of motion and neck supple.  Skin:    General: Skin is warm and dry.     Capillary Refill: Capillary refill takes less than 2 seconds.  Neurological:     General: No focal deficit present.     Mental Status: He is alert and oriented to person, place, and time.     Cranial Nerves: No cranial nerve deficit.     Motor: No weakness.     Deep Tendon Reflexes: Reflexes normal.  Psychiatric:        Mood and Affect: Mood normal.        Behavior: Behavior normal.        Thought Content: Thought content normal.        Judgment: Judgment normal.        11/29/2020    9:46 AM  6CIT Screen  What Year? 0 points  What month? 0 points  What time? 0 points  Count back from 20 0 points  Months in reverse 2 points  Repeat phrase 4 points  Total Score 6 points    Cognitive Testing - 6-CIT  Correct? Score   What year is it? yes 0 Yes = 0    No = 4  What month  is it? yes 0 Yes = 0    No = 3  Remember:     [place] [address] [place], [place]     What time is it? yes 0 Yes = 0    No = 3  Count backwards from 20 to 1 yes 0 Correct = 0    1 error = 2   More than 1 error = 4  Say the months of the year in reverse. yes 0 Correct = 0    1 error = 2   More than 1 error = 4  What address did I ask you to remember? yes 0 Correct = 0  1 error = 2    2 error = 4    3 error = 6    4 error = 8    All wrong = 10       TOTAL SCORE  0/28   Interpretation:  Normal  Normal (0-7) Abnormal (8-28)    Results for orders placed or performed in visit on 10/10/22  HgB A1c  Result Value Ref  Range   Hgb A1c MFr Bld 6.2 (H) 4.8 - 5.6 %   Est. average glucose Bld gHb Est-mCnc 131 mg/dL      Assessment & Plan:   Problem List Items Addressed This Visit       Other   Hyperlipidemia   Anxiety - Primary   Prediabetes     Preventative Services:  Health Risk Assessment and Personalized Prevention Plan: Reviewed during visit Bone Mass Measurements: NA CVD Screening: Up to date Colon Cancer Screening: Up to date Depression Screening: Done today Diabetes Screening: Up to date Glaucoma Screening: Eye exam Hepatitis B vaccine: NA Hepatitis C screening: Up to date HIV Screening: Up to date Flu Vaccine: Done today Lung cancer Screening: NA Obesity Screening: Done today Pneumonia Vaccines (2): Up to date STI Screening: NA PSA screening: Done today  Discussed aspirin prophylaxis for myocardial infarction prevention and decision was made to continue ASA  LABORATORY TESTING:  Health maintenance labs ordered today as discussed above.   The natural history of prostate cancer and ongoing controversy regarding screening and potential treatment outcomes of prostate cancer has been discussed with the patient. The meaning of a false positive PSA and a false negative PSA has been discussed. He indicates understanding of the limitations of this screening test and wishes to proceed with screening PSA testing.   IMMUNIZATIONS:   - Tdap: Tetanus vaccination status reviewed: last tetanus booster within 10 years. - Influenza: Up to date - Pneumovax: Up to date - Prevnar: Up to date - Zostavax vaccine:  Discussed at visit today  SCREENING: - Colonoscopy: Up to date  Discussed with patient purpose of the colonoscopy is to detect colon cancer at curable precancerous or early stages   - AAA Screening: Not applicable  -Hearing Test: Not applicable  -Spirometry: Not applicable   PATIENT COUNSELING:    Sexuality: Discussed sexually transmitted diseases, partner selection, use of  condoms, avoidance of unintended pregnancy  and contraceptive alternatives.   Advised to avoid cigarette smoking.  I discussed with the patient that most people either abstain from alcohol or drink within safe limits (<=14/week and <=4 drinks/occasion for males, <=7/weeks and <= 3 drinks/occasion for females) and that the risk for alcohol disorders and other health effects rises proportionally with the number of drinks per week and how often a drinker exceeds daily limits.  Discussed cessation/primary prevention of drug use and availability  of treatment for abuse.   Diet: Encouraged to adjust caloric intake to maintain  or achieve ideal body weight, to reduce intake of dietary saturated fat and total fat, to limit sodium intake by avoiding high sodium foods and not adding table salt, and to maintain adequate dietary potassium and calcium preferably from fresh fruits, vegetables, and low-fat dairy products.    stressed the importance of regular exercise  Injury prevention: Discussed safety belts, safety helmets, smoke detector, smoking near bedding or upholstery.   Dental health: Discussed importance of regular tooth brushing, flossing, and dental visits.   Follow up plan: NEXT PREVENTATIVE PHYSICAL DUE IN 1 YEAR. No follow-ups on file.

## 2023-01-11 ENCOUNTER — Encounter: Payer: Self-pay | Admitting: Nurse Practitioner

## 2023-01-11 ENCOUNTER — Ambulatory Visit: Payer: Medicare HMO | Admitting: Nurse Practitioner

## 2023-01-11 VITALS — BP 135/87 | HR 79 | Temp 97.8°F | Resp 14 | Ht 70.0 in | Wt 190.4 lb

## 2023-01-11 DIAGNOSIS — E785 Hyperlipidemia, unspecified: Secondary | ICD-10-CM | POA: Diagnosis not present

## 2023-01-11 DIAGNOSIS — Z7189 Other specified counseling: Secondary | ICD-10-CM

## 2023-01-11 DIAGNOSIS — F419 Anxiety disorder, unspecified: Secondary | ICD-10-CM

## 2023-01-11 DIAGNOSIS — I10 Essential (primary) hypertension: Secondary | ICD-10-CM | POA: Diagnosis not present

## 2023-01-11 DIAGNOSIS — Z0000 Encounter for general adult medical examination without abnormal findings: Secondary | ICD-10-CM

## 2023-01-11 DIAGNOSIS — E782 Mixed hyperlipidemia: Secondary | ICD-10-CM | POA: Diagnosis not present

## 2023-01-11 DIAGNOSIS — R7303 Prediabetes: Secondary | ICD-10-CM

## 2023-01-11 LAB — BAYER DCA HB A1C WAIVED: HB A1C (BAYER DCA - WAIVED): 5.8 % — ABNORMAL HIGH (ref 4.8–5.6)

## 2023-01-11 MED ORDER — ATORVASTATIN CALCIUM 20 MG PO TABS: 20.0000 mg | ORAL_TABLET | Freq: Every day | ORAL | 1 refills | Status: DC

## 2023-01-11 MED ORDER — HYDROCHLOROTHIAZIDE 12.5 MG PO TABS
12.5000 mg | ORAL_TABLET | Freq: Every day | ORAL | 1 refills | Status: DC
Start: 2023-01-11 — End: 2023-07-30

## 2023-01-11 NOTE — Assessment & Plan Note (Signed)
Chronic.  Controlled.  Continue with current medication regimen of Atorvastatin daily.  Refills sent today.  Labs ordered today.  Return to clinic in 6 months for reevaluation.  Call sooner if concerns arise.    

## 2023-01-11 NOTE — Assessment & Plan Note (Signed)
Chronic.  Controlled.  Continue with current medication regimen of HCTZ 12.5mg .  Refills sent today.  Labs ordered today.  Return to clinic in 6 months for reevaluation.  Call sooner if concerns arise.

## 2023-01-11 NOTE — Assessment & Plan Note (Signed)
Chronic.  Controlled without medication..  Labs ordered today.  Return to clinic in 6 months for reevaluation.  Call sooner if concerns arise.  ° °

## 2023-01-11 NOTE — Patient Instructions (Signed)
  Mr. Dennis Mclaughlin , Thank you for taking time to come for your Medicare Wellness Visit. I appreciate your ongoing commitment to your health goals. Please review the following plan we discussed and let me know if I can assist you in the future.   These are the goals we discussed:  Goals      Patient Stated     11/29/2020, no goals        This is a list of the screening recommended for you and due dates:  Health Maintenance  Topic Date Due   Zoster (Shingles) Vaccine (1 of 2) Never done   COVID-19 Vaccine (3 - 2023-24 season) 10/29/2022   Medicare Annual Wellness Visit  01/11/2024   Colon Cancer Screening  03/23/2024   DTaP/Tdap/Td vaccine (2 - Td or Tdap) 01/11/2027   Pneumonia Vaccine  Completed   Flu Shot  Completed   Hepatitis C Screening  Completed   HPV Vaccine  Aged Out

## 2023-01-11 NOTE — Assessment & Plan Note (Signed)
A1c improved from 6.2% to 5.8% with lifestyle changes.  Praised patient for efforts to make changes and improve A1c.  Continue with walking daily and avoiding sugary drinks as well as his ice cream.

## 2023-01-11 NOTE — Assessment & Plan Note (Signed)
A voluntary discussion about advance care planning including the explanation and discussion of advance directives was extensively discussed  with the patient for 7 minutes.  Explanation about the health care proxy and Living will was reviewed and packet with forms with explanation of how to fill them out was given.  During this discussion, the patient states he does not have someone he can designate as his medical power of attorney.  Information given to patient regarding options.

## 2023-01-12 LAB — COMPREHENSIVE METABOLIC PANEL
ALT: 29 [IU]/L (ref 0–44)
AST: 27 [IU]/L (ref 0–40)
Albumin: 4.3 g/dL (ref 3.9–4.9)
Alkaline Phosphatase: 126 [IU]/L — ABNORMAL HIGH (ref 44–121)
BUN/Creatinine Ratio: 12 (ref 10–24)
BUN: 12 mg/dL (ref 8–27)
Bilirubin Total: 0.4 mg/dL (ref 0.0–1.2)
CO2: 22 mmol/L (ref 20–29)
Calcium: 9.4 mg/dL (ref 8.6–10.2)
Chloride: 103 mmol/L (ref 96–106)
Creatinine, Ser: 1.04 mg/dL (ref 0.76–1.27)
Globulin, Total: 2.3 g/dL (ref 1.5–4.5)
Glucose: 88 mg/dL (ref 70–99)
Potassium: 4.2 mmol/L (ref 3.5–5.2)
Sodium: 140 mmol/L (ref 134–144)
Total Protein: 6.6 g/dL (ref 6.0–8.5)
eGFR: 77 mL/min/{1.73_m2} (ref >59)

## 2023-01-12 LAB — LIPID PANEL
Chol/HDL Ratio: 4.6 ratio (ref 0.0–5.0)
Cholesterol, Total: 179 mg/dL (ref 100–199)
HDL: 39 mg/dL — ABNORMAL LOW (ref >39)
LDL Chol Calc (NIH): 117 mg/dL — ABNORMAL HIGH (ref 0–99)
Triglycerides: 126 mg/dL (ref 0–149)
VLDL Cholesterol Cal: 23 mg/dL (ref 5–40)

## 2023-03-09 ENCOUNTER — Ambulatory Visit: Payer: Medicare HMO | Admitting: Urology

## 2023-03-14 ENCOUNTER — Ambulatory Visit: Payer: Medicare HMO | Admitting: Urology

## 2023-03-14 VITALS — BP 130/82 | HR 72 | Ht 70.0 in | Wt 190.0 lb

## 2023-03-14 DIAGNOSIS — G894 Chronic pain syndrome: Secondary | ICD-10-CM

## 2023-03-14 DIAGNOSIS — N411 Chronic prostatitis: Secondary | ICD-10-CM

## 2023-03-14 LAB — URINALYSIS, COMPLETE
Bilirubin, UA: NEGATIVE
Glucose, UA: NEGATIVE
Ketones, UA: NEGATIVE
Leukocytes,UA: NEGATIVE
Nitrite, UA: NEGATIVE
Protein,UA: NEGATIVE
RBC, UA: NEGATIVE
Specific Gravity, UA: 1.025 (ref 1.005–1.030)
Urobilinogen, Ur: 0.2 mg/dL (ref 0.2–1.0)
pH, UA: 5.5 (ref 5.0–7.5)

## 2023-03-14 LAB — MICROSCOPIC EXAMINATION

## 2023-03-14 MED ORDER — AMITRIPTYLINE HCL 25 MG PO TABS: 25.0000 mg | ORAL_TABLET | Freq: Every day | ORAL | 1 refills | Status: DC

## 2023-03-14 NOTE — Progress Notes (Signed)
I, Dennis Mclaughlin, acting as a scribe for Dennis Altes, MD., have documented all relevant documentation on the behalf of Dennis Altes, MD, as directed by Dennis Altes, MD while in the presence of Dennis Altes, MD.  03/14/2023 8:15 PM   Cecelia Byars 1950/06/07 [id number]  Referring provider: Larae Grooms, NP [address] [place],  [place] [address]  Chief Complaint  Patient presents with   Prostatitis   Urologic history: 1. Chronic prostatitis/chronic pelvic pain syndrome  HPI: Dennis Mclaughlin is a 73 y.o. male presents for annual follow-up.  Refer to my previous note 12/29/2020 for a clinical summary Cystoscopy December 2022 showed moderate prostate enlargement and prostate volume was 34 g on TRUS Since last year's visit, he has stable lower urinary tract symptoms. He ran out of amitriptyline 3 days ago and has not seen any worsening of his symptoms and is considering discontinuing the medication.   PMH: Past Medical History:  Diagnosis Date   History of prostatitis    Hyperlipidemia    Low testosterone     Surgical History: Past Surgical History:  Procedure Laterality Date   BICEPT TENODESIS Right 04/12/2015   Procedure: OPEN BICEPS TENODESIS;  Surgeon: Jones Broom, MD;  Location: [facility];  Service: Orthopedics;  Laterality: Right;   COLONOSCOPY W/ BIOPSIES     SHOULDER ARTHROSCOPY WITH DISTAL CLAVICLE RESECTION Right 04/12/2015   Procedure: SHOULDER ARTHROSCOPY WITH DISTAL CLAVICLE RESECTION;  Surgeon: Jones Broom, MD;  Location: [facility];  Service: Orthopedics;  Laterality: Right;   SHOULDER ARTHROSCOPY WITH SUBACROMIAL DECOMPRESSION Right 04/12/2015   Procedure: SHOULDER ARTHROSCOPY WITH SUBACROMIAL DECOMPRESSION DEBRIDEMENT LABRAL TEAR;  Surgeon: Jones Broom, MD;  Location: [facility];  Service: Orthopedics;  Laterality: Right;    Home Medications:  Allergies as of 03/14/2023        Reactions   Alfuzosin    Dizziness         Medication List        Accurate as of March 14, 2023  8:15 PM. If you have any questions, ask your nurse or doctor.          amitriptyline 25 MG tablet Commonly known as: ELAVIL Take 1 tablet (25 mg total) by mouth at bedtime. What changed: See the new instructions.   aspirin EC 81 MG tablet Take 81 mg by mouth daily. Swallow whole.   atorvastatin 20 MG tablet Commonly known as: LIPITOR Take 1 tablet (20 mg total) by mouth daily.   calcium carbonate 750 MG chewable tablet Commonly known as: TUMS EX Chew 1 tablet by mouth daily.   Fish Oil 1200 MG Caps Take 1,200 mg by mouth daily.   fluticasone 50 MCG/ACT nasal spray Commonly known as: FLONASE Place 2 sprays into both nostrils daily.   hydrochlorothiazide 12.5 MG tablet Commonly known as: HYDRODIURIL Take 1 tablet (12.5 mg total) by mouth daily.   vitamin B-12 500 MCG tablet Commonly known as: CYANOCOBALAMIN Take 500 mcg by mouth daily.   Vitamin D3 125 MCG (5000 UT) Tabs Take 125 Units by mouth daily.        Allergies:  Allergies  Allergen Reactions   Alfuzosin     Dizziness     Family History: Family History  Problem Relation Age of Onset   Hypertension Mother    Diabetes Maternal Aunt    Hearing loss Paternal Uncle    Stroke Paternal Uncle    Cancer Paternal  Grandfather        prostate   COPD Neg Hx     Social History:  reports that he quit smoking about 33 years ago. His smoking use included cigarettes. He has never used smokeless tobacco. He reports current alcohol use. He reports that he does not use drugs.   Physical Exam: BP 130/82   Pulse 72   Ht 5\' 10"  (1.778 m)   Wt 190 lb (86.2 kg)   BMI 27.26 kg/m   Constitutional:  Alert and oriented, No acute distress. HEENT: [place] AT Respiratory: Normal respiratory effort, no increased work of breathing. Psychiatric: Normal mood and affect.  Urinalysis Dipstick/microscopy negative     Assessment & Plan:    1. Chronic prostatitis/chronic pelvic pain syndrome Stable He is considering discontinuing the amitriptyline. If his symptoms recur once stopping, he will restart.  Will tentatively schedule an annual follow-up, however if he is asymptomatic and not taking the amitriptyline, he may cancel the appointment.   I have reviewed the above documentation for accuracy and completeness, and I agree with the above.   Dennis Altes, MD  [facility] [address], [address] [place], [place] [address] [phone]

## 2023-03-15 ENCOUNTER — Encounter: Payer: Self-pay | Admitting: Urology

## 2023-06-14 ENCOUNTER — Telehealth: Payer: Self-pay | Admitting: Nurse Practitioner

## 2023-06-14 NOTE — Telephone Encounter (Signed)
 Patient dropped off document  Medical Clearance Form , to be filled out by provider. Patient requested to send it back via Call Patient to pick up within 5-days. Document is located in providers folder. Please advise at Mobile [phone] (mobile)

## 2023-06-15 NOTE — Telephone Encounter (Signed)
 Paperwork completed by provider. Called and notified patient that the form was ready to be picked up. Copy placed in scan bin to be scanned into the patient's chart.

## 2023-07-11 ENCOUNTER — Ambulatory Visit: Payer: Self-pay | Admitting: Nurse Practitioner

## 2023-07-30 ENCOUNTER — Encounter: Payer: Self-pay | Admitting: Nurse Practitioner

## 2023-07-30 ENCOUNTER — Ambulatory Visit (INDEPENDENT_AMBULATORY_CARE_PROVIDER_SITE_OTHER): Admitting: Nurse Practitioner

## 2023-07-30 VITALS — BP 131/83 | HR 71 | Ht 70.0 in | Wt 195.0 lb

## 2023-07-30 DIAGNOSIS — N411 Chronic prostatitis: Secondary | ICD-10-CM | POA: Diagnosis not present

## 2023-07-30 DIAGNOSIS — I10 Essential (primary) hypertension: Secondary | ICD-10-CM | POA: Diagnosis not present

## 2023-07-30 DIAGNOSIS — Z0000 Encounter for general adult medical examination without abnormal findings: Secondary | ICD-10-CM | POA: Diagnosis not present

## 2023-07-30 DIAGNOSIS — R7303 Prediabetes: Secondary | ICD-10-CM

## 2023-07-30 DIAGNOSIS — F419 Anxiety disorder, unspecified: Secondary | ICD-10-CM

## 2023-07-30 DIAGNOSIS — E782 Mixed hyperlipidemia: Secondary | ICD-10-CM

## 2023-07-30 DIAGNOSIS — R051 Acute cough: Secondary | ICD-10-CM

## 2023-07-30 MED ORDER — AMOXICILLIN 500 MG PO CAPS
500.0000 mg | ORAL_CAPSULE | Freq: Two times a day (BID) | ORAL | 0 refills | Status: AC
Start: 2023-07-30 — End: 2023-08-09

## 2023-07-30 MED ORDER — ATORVASTATIN CALCIUM 20 MG PO TABS: 20.0000 mg | ORAL_TABLET | Freq: Every day | ORAL | 1 refills | Status: DC

## 2023-07-30 MED ORDER — HYDROCHLOROTHIAZIDE 12.5 MG PO TABS: 12.5000 mg | ORAL_TABLET | Freq: Every day | ORAL | 1 refills | Status: DC

## 2023-07-30 MED ORDER — METHYLPREDNISOLONE 4 MG PO TBPK: ORAL_TABLET | ORAL | 0 refills | Status: DC

## 2023-07-30 NOTE — Assessment & Plan Note (Signed)
Chronic.  Controlled.  Continue with current medication regimen of Atorvastatin daily.  Refills sent today.  Labs ordered today.  Return to clinic in 6 months for reevaluation.  Call sooner if concerns arise.    

## 2023-07-30 NOTE — Assessment & Plan Note (Signed)
Chronic.  Controlled.  Continue with current medication regimen of HCTZ 12.5mg .  Refills sent today.  Labs ordered today.  Return to clinic in 6 months for reevaluation.  Call sooner if concerns arise.

## 2023-07-30 NOTE — Assessment & Plan Note (Signed)
 Chronic.  Controlled.  Continue with current medication regimen.  Labs ordered today.  Return to clinic in 6 months for reevaluation.  Call sooner if concerns arise.  ? ?

## 2023-07-30 NOTE — Progress Notes (Signed)
 BP 131/83   Pulse 71   Ht 5\' 10"  (1.778 m)   Wt 195 lb (88.5 kg)   BMI 27.98 kg/m    Subjective:    Patient ID: Dennis Mclaughlin, male    DOB: 1950/03/15, 73 y.o.   MRN: [id number]  HPI: Dennis Mclaughlin is a 73 y.o. male presenting on 07/30/2023 for comprehensive medical examination. Current medical complaints include: none  He currently lives with: Interim Problems from his last visit: no  Please send results to Dr. Cherlynn Cornfield.  HYPERTENSION / HYPERLIPIDEMIA Satisfied with current treatment? yes Duration of hypertension: years BP monitoring frequency: not checking BP range:  BP medication side effects: no Past BP meds: HCTZ Duration of hyperlipidemia: years Cholesterol medication side effects: no Cholesterol supplements: none Past cholesterol medications: atorvastain (lipitor) Medication compliance: excellent compliance Aspirin: no Recent stressors: no Recurrent headaches: no Visual changes: no Palpitations: no Dyspnea: no Chest pain: no Lower extremity edema:no Dizzy/lightheaded: no  Patient has had an Upper Respiratory Virus.  States his cough has been lingering for over 3 weeks.  Still having some congestion in the upper respiratory area.  No fevers, SOB, ear pain.  MOOD Patient states his mood has been fine.  Denies concerns at visit today.  Denies SI.   Depression Screen done today and results listed below:     07/11/2022    8:23 AM 07/06/2021    8:32 AM 11/29/2020    9:45 AM 05/27/2020    9:35 AM 02/17/2019    9:49 AM  Depression screen PHQ 2/9  Decreased Interest 0 0 0 0 0  Down, Depressed, Hopeless 0 0 0 0 0  PHQ - 2 Score 0 0 0 0 0  Altered sleeping 0 0   0  Tired, decreased energy 0 0   0  Change in appetite 0 0   0  Feeling bad or failure about yourself  0 0   0  Trouble concentrating 0 0   0  Moving slowly or fidgety/restless 0 0   0  Suicidal thoughts 0 0   0  PHQ-9 Score 0 0   0  Difficult doing work/chores Not difficult at all Not difficult at all        The patient does not have a history of falls. I did complete a risk assessment for falls. A plan of care for falls was documented.   Past Medical History:  Past Medical History:  Diagnosis Date   History of prostatitis    Hyperlipidemia    Low testosterone      Surgical History:  Past Surgical History:  Procedure Laterality Date   BICEPT TENODESIS Right 04/12/2015   Procedure: OPEN BICEPS TENODESIS;  Surgeon: Sammye Cristal, MD;  Location: [facility];  Service: Orthopedics;  Laterality: Right;   COLONOSCOPY W/ BIOPSIES     SHOULDER ARTHROSCOPY WITH DISTAL CLAVICLE RESECTION Right 04/12/2015   Procedure: SHOULDER ARTHROSCOPY WITH DISTAL CLAVICLE RESECTION;  Surgeon: Sammye Cristal, MD;  Location: [facility];  Service: Orthopedics;  Laterality: Right;   SHOULDER ARTHROSCOPY WITH SUBACROMIAL DECOMPRESSION Right 04/12/2015   Procedure: SHOULDER ARTHROSCOPY WITH SUBACROMIAL DECOMPRESSION DEBRIDEMENT LABRAL TEAR;  Surgeon: Sammye Cristal, MD;  Location: [facility];  Service: Orthopedics;  Laterality: Right;    Medications:  Current Outpatient Medications on File Prior to Visit  Medication Sig   amitriptyline  (ELAVIL ) 25 MG tablet Take 1 tablet (25 mg total) by mouth at bedtime.   aspirin EC 81 MG tablet  Take 81 mg by mouth daily. Swallow whole.   calcium  carbonate (TUMS EX) 750 MG chewable tablet Chew 1 tablet by mouth daily.   Cholecalciferol (VITAMIN D3) 125 MCG (5000 UT) TABS Take 125 Units by mouth daily.   fluticasone  (FLONASE ) 50 MCG/ACT nasal spray Place 2 sprays into both nostrils daily.   Omega-3 Fatty Acids (FISH OIL) 1200 MG CAPS Take 1,200 mg by mouth daily.   vitamin B-12 (CYANOCOBALAMIN) 500 MCG tablet Take 500 mcg by mouth daily.   No current facility-administered medications on file prior to visit.    Allergies:  Allergies  Allergen Reactions   Alfuzosin      Dizziness     Social History:  Social History    Socioeconomic History   Marital status: Married    Spouse name: Not on file   Number of children: Not on file   Years of education: Not on file   Highest education level: Not on file  Occupational History   Not on file  Tobacco Use   Smoking status: Former    Current packs/day: 0.00    Types: Cigarettes    Quit date: 01/11/1990    Years since quitting: 33.5   Smokeless tobacco: Never  Vaping Use   Vaping status: Never Used  Substance and Sexual Activity   Alcohol use: Yes    Comment: socially   Drug use: No   Sexual activity: Not on file  Other Topics Concern   Not on file  Social History Narrative   Not on file   Social Drivers of Health   Financial Resource Strain: Low Risk  (11/29/2020)   Overall Financial Resource Strain (CARDIA)    Difficulty of Paying Living Expenses: Not hard at all  Food Insecurity: No Food Insecurity (11/29/2020)   Hunger Vital Sign    Worried About Running Out of Food in the Last Year: Never true    Ran Out of Food in the Last Year: Never true  Transportation Needs: No Transportation Needs (11/29/2020)   PRAPARE - Administrator, Civil Service (Medical): No    Lack of Transportation (Non-Medical): No  Physical Activity: Inactive (11/29/2020)   Exercise Vital Sign    Days of Exercise per Week: 0 days    Minutes of Exercise per Session: 0 min  Stress: Stress Concern Present (11/29/2020)   [employer] of Occupational Health - Occupational Stress Questionnaire    Feeling of Stress : To some extent  Social Connections: Not on file  Intimate Partner Violence: Not on file   Social History   Tobacco Use  Smoking Status Former   Current packs/day: 0.00   Types: Cigarettes   Quit date: 01/11/1990   Years since quitting: 33.5  Smokeless Tobacco Never   Social History   Substance and Sexual Activity  Alcohol Use Yes   Comment: socially    Family History:  Family History  Problem Relation Age of Onset   Hypertension  Mother    Diabetes Maternal Aunt    Hearing loss Paternal Uncle    Stroke Paternal Uncle    Cancer Paternal Grandfather        prostate   COPD Neg Hx     Past medical history, surgical history, medications, allergies, family history and social history reviewed with patient today and changes made to appropriate areas of the chart.   Review of Systems  Constitutional:  Negative for fever.  HENT:  Positive for congestion. Negative for ear pain.   Eyes:  Negative for blurred vision and double vision.  Respiratory:  Positive for cough. Negative for shortness of breath.   Cardiovascular:  Negative for chest pain, palpitations and leg swelling.  Neurological:  Negative for dizziness and headaches.  Psychiatric/Behavioral:  Negative for depression and suicidal ideas. The patient is not nervous/anxious.    All other ROS negative except what is listed above and in the HPI.      Objective:    BP 131/83   Pulse 71   Ht 5\' 10"  (1.778 m)   Wt 195 lb (88.5 kg)   BMI 27.98 kg/m   Wt Readings from Last 3 Encounters:  07/30/23 195 lb (88.5 kg)  03/14/23 190 lb (86.2 kg)  01/11/23 190 lb 6.4 oz (86.4 kg)    Physical Exam Vitals and nursing note reviewed.  Constitutional:      General: He is not in acute distress.    Appearance: Normal appearance. He is normal weight. He is not ill-appearing, toxic-appearing or diaphoretic.  HENT:     Head: Normocephalic.     Right Ear: Tympanic membrane, ear canal and external ear normal.     Left Ear: Tympanic membrane, ear canal and external ear normal.     Nose: Congestion and rhinorrhea present.     Mouth/Throat:     Mouth: Mucous membranes are moist.     Pharynx: Posterior oropharyngeal erythema present. No oropharyngeal exudate.  Eyes:     General:        Right eye: No discharge.        Left eye: No discharge.     Extraocular Movements: Extraocular movements intact.     Conjunctiva/sclera: Conjunctivae normal.     Pupils: Pupils are equal,  round, and reactive to light.  Cardiovascular:     Rate and Rhythm: Normal rate and regular rhythm.     Heart sounds: No murmur heard. Pulmonary:     Effort: Pulmonary effort is normal. No respiratory distress.     Breath sounds: Normal breath sounds. No wheezing, rhonchi or rales.  Abdominal:     General: Abdomen is flat. Bowel sounds are normal. There is no distension.     Palpations: Abdomen is soft.     Tenderness: There is no abdominal tenderness. There is no guarding.  Musculoskeletal:     Cervical back: Normal range of motion and neck supple.  Skin:    General: Skin is warm and dry.     Capillary Refill: Capillary refill takes less than 2 seconds.  Neurological:     General: No focal deficit present.     Mental Status: He is alert and oriented to person, place, and time.     Cranial Nerves: No cranial nerve deficit.     Motor: No weakness.     Deep Tendon Reflexes: Reflexes normal.  Psychiatric:        Mood and Affect: Mood normal.        Behavior: Behavior normal.        Thought Content: Thought content normal.        Judgment: Judgment normal.     Results for orders placed or performed in visit on 03/14/23  Microscopic Examination   Collection Time: 03/14/23  3:16 PM   Urine  Result Value Ref Range   WBC, UA 0-5 0 - 5 /hpf   RBC, Urine 0-2 0 - 2 /hpf   Epithelial Cells (non renal) 0-10 0 - 10 /hpf   Mucus, UA Present (A) Not Estab.  Bacteria, UA Few None seen/Few  Urinalysis, Complete   Collection Time: 03/14/23  3:16 PM  Result Value Ref Range   Specific Gravity, UA 1.025 1.005 - 1.030   pH, UA 5.5 5.0 - 7.5   Color, UA Yellow Yellow   Appearance Ur Clear Clear   Leukocytes,UA Negative Negative   Protein,UA Negative Negative/Trace   Glucose, UA Negative Negative   Ketones, UA Negative Negative   RBC, UA Negative Negative   Bilirubin, UA Negative Negative   Urobilinogen, Ur 0.2 0.2 - 1.0 mg/dL   Nitrite, UA Negative Negative   Microscopic Examination  See below:       Assessment & Plan:   Problem List Items Addressed This Visit       Cardiovascular and Mediastinum   Essential hypertension   Chronic.  Controlled.  Continue with current medication regimen of HCTZ 12.5mg .  Refills sent today.  Labs ordered today.  Return to clinic in 6 months for reevaluation.  Call sooner if concerns arise.       Relevant Medications   atorvastatin  (LIPITOR) 20 MG tablet   hydrochlorothiazide  (HYDRODIURIL ) 12.5 MG tablet     Genitourinary   Chronic prostatitis   Followed by Urology.  Last seen in January. Reviewed most recent note.        Other   Hyperlipidemia   Chronic.  Controlled.  Continue with current medication regimen of Atorvastatin  daily.  Refills sent today.  Labs ordered today.  Return to clinic in 6 months for reevaluation.  Call sooner if concerns arise.       Relevant Medications   atorvastatin  (LIPITOR) 20 MG tablet   hydrochlorothiazide  (HYDRODIURIL ) 12.5 MG tablet   Other Relevant Orders   Lipid panel   Anxiety   Chronic.  Controlled.  Continue with current medication regimen.  Labs ordered today.  Return to clinic in 6 months for reevaluation.  Call sooner if concerns arise.        Prediabetes   Last A1c was 5.8%.  Praised patient for efforts to make changes and improve A1c.  Continue with walking daily and avoiding sugary drinks as well as his ice cream.       Relevant Orders   Hemoglobin A1c   Other Visit Diagnoses       Annual physical exam    -  Primary   Health maintenance reviewed during visit today.  Labs ordered.  Vaccines reviewed.  Colon cancer screening up to date.   Relevant Orders   TSH   PSA   Lipid panel   CBC with Differential/Platelet   Comprehensive metabolic panel with GFR   Hemoglobin A1c     Acute cough       Will treat with amoxicillin  and medrol dose pak.  Complete course of treatment.  Follow up if not improved.        Discussed aspirin prophylaxis for myocardial infarction  prevention and decision was it was not indicated  LABORATORY TESTING:  Health maintenance labs ordered today as discussed above.   The natural history of prostate cancer and ongoing controversy regarding screening and potential treatment outcomes of prostate cancer has been discussed with the patient. The meaning of a false positive PSA and a false negative PSA has been discussed. He indicates understanding of the limitations of this screening test and wishes to proceed with screening PSA testing.   IMMUNIZATIONS:   - Tdap: Tetanus vaccination status reviewed: last tetanus booster within 10 years. - Influenza: Postponed to flu season -  Pneumovax: Up to date - Prevnar: Up to date - HPV: Not applicable - Shingrix vaccine: Discussed at visit today.  SCREENING: - Colonoscopy: Up to date  Discussed with patient purpose of the colonoscopy is to detect colon cancer at curable precancerous or early stages   - AAA Screening: Not applicable  -Hearing Test: Not applicable  -Spirometry: Not applicable   PATIENT COUNSELING:    Sexuality: Discussed sexually transmitted diseases, partner selection, use of condoms, avoidance of unintended pregnancy  and contraceptive alternatives.   Advised to avoid cigarette smoking.  I discussed with the patient that most people either abstain from alcohol or drink within safe limits (<=14/week and <=4 drinks/occasion for males, <=7/weeks and <= 3 drinks/occasion for females) and that the risk for alcohol disorders and other health effects rises proportionally with the number of drinks per week and how often a drinker exceeds daily limits.  Discussed cessation/primary prevention of drug use and availability of treatment for abuse.   Diet: Encouraged to adjust caloric intake to maintain  or achieve ideal body weight, to reduce intake of dietary saturated fat and total fat, to limit sodium intake by avoiding high sodium foods and not adding table salt, and to  maintain adequate dietary potassium and calcium  preferably from fresh fruits, vegetables, and low-fat dairy products.    stressed the importance of regular exercise  Injury prevention: Discussed safety belts, safety helmets, smoke detector, smoking near bedding or upholstery.   Dental health: Discussed importance of regular tooth brushing, flossing, and dental visits.   Follow up plan: NEXT PREVENTATIVE PHYSICAL DUE IN 1 YEAR. Return in about 6 months (around 01/29/2024) for HTN, HLD, DM2 FU.

## 2023-07-30 NOTE — Assessment & Plan Note (Signed)
 Followed by Urology.  Last seen in January. Reviewed most recent note.

## 2023-07-30 NOTE — Assessment & Plan Note (Signed)
 Last A1c was 5.8%.  Praised patient for efforts to make changes and improve A1c.  Continue with walking daily and avoiding sugary drinks as well as his ice cream.

## 2023-07-31 ENCOUNTER — Ambulatory Visit: Payer: Self-pay | Admitting: Nurse Practitioner

## 2023-07-31 DIAGNOSIS — R7989 Other specified abnormal findings of blood chemistry: Secondary | ICD-10-CM

## 2023-07-31 LAB — COMPREHENSIVE METABOLIC PANEL WITH GFR
ALT: 30 IU/L (ref 0–44)
AST: 25 IU/L (ref 0–40)
Albumin: 4.2 g/dL (ref 3.9–4.9)
Alkaline Phosphatase: 109 IU/L (ref 44–121)
BUN/Creatinine Ratio: 14 (ref 10–24)
BUN: 13 mg/dL (ref 8–27)
Bilirubin Total: 0.3 mg/dL (ref 0.0–1.2)
CO2: 19 mmol/L — ABNORMAL LOW (ref 20–29)
Calcium: 9.2 mg/dL (ref 8.6–10.2)
Chloride: 103 mmol/L (ref 96–106)
Creatinine, Ser: 0.94 mg/dL (ref 0.76–1.27)
Globulin, Total: 2.1 g/dL (ref 1.5–4.5)
Glucose: 100 mg/dL — ABNORMAL HIGH (ref 70–99)
Potassium: 4.2 mmol/L (ref 3.5–5.2)
Sodium: 136 mmol/L (ref 134–144)
Total Protein: 6.3 g/dL (ref 6.0–8.5)
eGFR: 87 mL/min/{1.73_m2} (ref >59)

## 2023-07-31 LAB — LIPID PANEL
Chol/HDL Ratio: 5.9 ratio — ABNORMAL HIGH (ref 0.0–5.0)
Cholesterol, Total: 234 mg/dL — ABNORMAL HIGH (ref 100–199)
HDL: 40 mg/dL (ref >39)
LDL Chol Calc (NIH): 148 mg/dL — ABNORMAL HIGH (ref 0–99)
Triglycerides: 250 mg/dL — ABNORMAL HIGH (ref 0–149)
VLDL Cholesterol Cal: 46 mg/dL — ABNORMAL HIGH (ref 5–40)

## 2023-07-31 LAB — CBC WITH DIFFERENTIAL/PLATELET
Basophils Absolute: 0 10*3/uL (ref 0.0–0.2)
Basos: 1 %
EOS (ABSOLUTE): 0.2 10*3/uL (ref 0.0–0.4)
Eos: 2 %
Hematocrit: 43.5 % (ref 37.5–51.0)
Hemoglobin: 14.4 g/dL (ref 13.0–17.7)
Immature Grans (Abs): 0 10*3/uL (ref 0.0–0.1)
Immature Granulocytes: 0 %
Lymphocytes Absolute: 2.1 10*3/uL (ref 0.7–3.1)
Lymphs: 26 %
MCH: 31.5 pg (ref 26.6–33.0)
MCHC: 33.1 g/dL (ref 31.5–35.7)
MCV: 95 fL (ref 79–97)
Monocytes Absolute: 0.7 10*3/uL (ref 0.1–0.9)
Monocytes: 8 %
Neutrophils Absolute: 5.1 10*3/uL (ref 1.4–7.0)
Neutrophils: 63 %
Platelets: 214 10*3/uL (ref 150–450)
RBC: 4.57 x10E6/uL (ref 4.14–5.80)
RDW: 12.5 % (ref 11.6–15.4)
WBC: 8.1 10*3/uL (ref 3.4–10.8)

## 2023-07-31 LAB — HEMOGLOBIN A1C
Est. average glucose Bld gHb Est-mCnc: 126 mg/dL
Hgb A1c MFr Bld: 6 % — ABNORMAL HIGH (ref 4.8–5.6)

## 2023-07-31 LAB — TSH: TSH: 10.3 u[IU]/mL — ABNORMAL HIGH (ref 0.450–4.500)

## 2023-07-31 LAB — PSA: Prostate Specific Ag, Serum: 1.4 ng/mL (ref 0.0–4.0)

## 2023-08-01 MED ORDER — ATORVASTATIN CALCIUM 40 MG PO TABS: 40.0000 mg | ORAL_TABLET | Freq: Every day | ORAL | 1 refills | Status: DC

## 2023-08-01 NOTE — Addendum Note (Signed)
 Addended by: Aileen Alexanders on: 08/01/2023 08:06 AM   Modules accepted: Orders

## 2023-08-08 ENCOUNTER — Other Ambulatory Visit

## 2023-08-08 DIAGNOSIS — R7989 Other specified abnormal findings of blood chemistry: Secondary | ICD-10-CM

## 2023-08-09 LAB — TSH: TSH: 4.41 u[IU]/mL (ref 0.450–4.500)

## 2023-08-09 LAB — THYROID ANTIBODIES (THYROPEROXIDASE & THYROGLOBULIN): Thyroperoxidase Ab SerPl-aCnc: 15 [IU]/mL (ref 0–34)

## 2023-08-09 LAB — T4, FREE: Free T4: 1.25 ng/dL (ref 0.82–1.77)

## 2023-08-10 ENCOUNTER — Ambulatory Visit: Payer: Self-pay | Admitting: Nurse Practitioner

## 2024-01-29 ENCOUNTER — Ambulatory Visit: Admitting: Nurse Practitioner

## 2024-01-29 ENCOUNTER — Encounter: Payer: Self-pay | Admitting: Nurse Practitioner

## 2024-01-29 VITALS — BP 129/67 | HR 69 | Temp 98.0°F | Ht 70.0 in | Wt 197.6 lb

## 2024-01-29 DIAGNOSIS — Z1211 Encounter for screening for malignant neoplasm of colon: Secondary | ICD-10-CM

## 2024-01-29 DIAGNOSIS — I10 Essential (primary) hypertension: Secondary | ICD-10-CM

## 2024-01-29 DIAGNOSIS — R7303 Prediabetes: Secondary | ICD-10-CM | POA: Diagnosis not present

## 2024-01-29 DIAGNOSIS — E782 Mixed hyperlipidemia: Secondary | ICD-10-CM

## 2024-01-29 DIAGNOSIS — Z0000 Encounter for general adult medical examination without abnormal findings: Secondary | ICD-10-CM

## 2024-01-29 MED ORDER — HYDROCHLOROTHIAZIDE 12.5 MG PO TABS: 12.5000 mg | ORAL_TABLET | Freq: Every day | ORAL | 1 refills | Status: AC

## 2024-01-29 NOTE — Progress Notes (Signed)
 Chief Complaint  Patient presents with   Hypertension   Hyperlipidemia    6 month F/u.     Subjective:   Dennis Mclaughlin is a 73 y.o. male who presents for a Medicare Annual Wellness Visit.  Visit info / Clinical Intake: Medicare Wellness Visit Type:: Subsequent Annual Wellness Visit Persons participating in visit and providing information:: patient Medicare Wellness Visit Mode:: In-person (required for WTM) Interpreter Needed?: No Pre-visit prep was completed: yes AWV questionnaire completed by patient prior to visit?: no Living arrangements:: (!) lives alone Patient's Overall Health Status Rating: good Typical amount of pain: some Does pain affect daily life?: no Are you currently prescribed opioids?: no  Dietary Habits and Nutritional Risks How many meals a day?: 3 Eats fruit and vegetables daily?: yes Most meals are obtained by: preparing own meals; eating out In the last 2 weeks, have you had any of the following?: none Diabetic:: no  Functional Status Activities of Daily Living (to include ambulation/medication): Independent Ambulation: Independent Medication Administration: Independent Home Management (perform basic housework or laundry): Independent Manage your own finances?: yes Primary transportation is: driving Concerns about vision?: (!) yes (wears glasses) Concerns about hearing?: (!) yes (He has slight concerns) Uses hearing aids?: no Hear whispered voice?: yes (certain pitches he can't hear)  Fall Screening Falls in the past year?: 0 Number of falls in past year: 0 Was there an injury with Fall?: 0 Fall Risk Category Calculator: 0 Patient Fall Risk Level: Low Fall Risk  Fall Risk Patient at Risk for Falls Due to: No Fall Risks Fall risk Follow up: Falls evaluation completed  Home and Transportation Safety: All rugs have non-skid backing?: yes All stairs or steps have railings?: yes Grab bars in the bathtub or shower?: (!) no Have non-skid  surface in bathtub or shower?: (!) no Good home lighting?: yes Regular seat belt use?: yes Hospital stays in the last year:: no  Cognitive Assessment Difficulty concentrating, remembering, or making decisions? : no Will 6CIT or Mini Cog be Completed: yes What year is it?: 0 points What month is it?: 0 points Give patient an address phrase to remember (5 components): [address] [place] what time is it?: 0 points Count backwards from 20 to 1: 0 points Say the months of the year in reverse: 4 points Repeat the address phrase from earlier: 0 points 6 CIT Score: 4 points  Advance Directives (For Healthcare) Does Patient Have a Medical Advance Directive?: Yes Does patient want to make changes to medical advance directive?: No - Patient declined Type of Advance Directive: Healthcare Power of Dennis Mclaughlin; Living will  Reviewed/Updated  Reviewed/Updated: Reviewed All (Medical, Surgical, Family, Medications, Allergies, Care Teams, Patient Goals); Medical History; Surgical History; Family History; Medications; Allergies; Care Teams; Patient Goals    Allergies (verified) Alfuzosin    Current Medications (verified) Outpatient Encounter Medications as of 01/29/2024  Medication Sig   Cholecalciferol (VITAMIN D3) 125 MCG (5000 UT) TABS Take 125 Units by mouth daily.   hydrochlorothiazide  (HYDRODIURIL ) 12.5 MG tablet Take 1 tablet (12.5 mg total) by mouth daily.   Red Yeast Rice Extract (RED YEAST RICE PO) Take 1,200 mg by mouth daily.   saw palmetto 500 MG capsule Take 450 mg by mouth daily.   vitamin B-12 (CYANOCOBALAMIN) 500 MCG tablet Take 500 mcg by mouth daily.   amitriptyline  (ELAVIL ) 25 MG tablet Take 1 tablet (25 mg total) by mouth at bedtime. (Patient not taking: Reported on 01/29/2024)   aspirin EC 81 MG  tablet Take 81 mg by mouth daily. Swallow whole. (Patient not taking: Reported on 01/29/2024)   atorvastatin  (LIPITOR) 40 MG tablet Take 1 tablet (40 mg total) by mouth daily.  (Patient not taking: Reported on 01/29/2024)   calcium  carbonate (TUMS EX) 750 MG chewable tablet Chew 1 tablet by mouth daily. (Patient not taking: Reported on 01/29/2024)   fluticasone  (FLONASE ) 50 MCG/ACT nasal spray Place 2 sprays into both nostrils daily. (Patient not taking: Reported on 01/29/2024)   methylPREDNISolone  (MEDROL  DOSEPAK) 4 MG TBPK tablet Take as directed (Patient not taking: Reported on 01/29/2024)   Omega-3 Fatty Acids (FISH OIL) 1200 MG CAPS Take 1,200 mg by mouth daily. (Patient not taking: Reported on 01/29/2024)   No facility-administered encounter medications on file as of 01/29/2024.    History: Past Medical History:  Diagnosis Date   History of prostatitis    Hyperlipidemia    Low testosterone     Past Surgical History:  Procedure Laterality Date   BICEPT TENODESIS Right 04/12/2015   Procedure: OPEN BICEPS TENODESIS;  Surgeon: Eva Herring, MD;  Location: [facility];  Service: Orthopedics;  Laterality: Right;   COLONOSCOPY W/ BIOPSIES     SHOULDER ARTHROSCOPY WITH DISTAL CLAVICLE RESECTION Right 04/12/2015   Procedure: SHOULDER ARTHROSCOPY WITH DISTAL CLAVICLE RESECTION;  Surgeon: Eva Herring, MD;  Location: [facility];  Service: Orthopedics;  Laterality: Right;   SHOULDER ARTHROSCOPY WITH SUBACROMIAL DECOMPRESSION Right 04/12/2015   Procedure: SHOULDER ARTHROSCOPY WITH SUBACROMIAL DECOMPRESSION DEBRIDEMENT LABRAL TEAR;  Surgeon: Eva Herring, MD;  Location: [facility];  Service: Orthopedics;  Laterality: Right;   Family History  Problem Relation Age of Onset   Hypertension Mother    Diabetes Maternal Aunt    Hearing loss Paternal Uncle    Stroke Paternal Uncle    Cancer Paternal Grandfather        prostate   COPD Neg Hx    Social History   Occupational History   Not on file  Tobacco Use   Smoking status: Former    Current packs/day: 0.00    Types: Cigarettes    Quit date: 01/11/1990    Years since  quitting: 34.0   Smokeless tobacco: Never  Vaping Use   Vaping status: Never Used  Substance and Sexual Activity   Alcohol use: Yes    Comment: socially   Drug use: No   Sexual activity: Not on file   Tobacco Counseling Counseling given: Not Answered  SDOH Screenings   Food Insecurity: No Food Insecurity (01/29/2024)  Housing: Low Risk  (01/29/2024)  Transportation Needs: No Transportation Needs (01/29/2024)  Utilities: Not At Risk (01/29/2024)  Depression (PHQ2-9): Low Risk  (01/29/2024)  Financial Resource Strain: Low Risk  (11/29/2020)  Physical Activity: Insufficiently Active (01/29/2024)  Social Connections: Socially Isolated (01/29/2024)  Stress: Stress Concern Present (01/29/2024)  Tobacco Use: Medium Risk (01/29/2024)  Health Literacy: Adequate Health Literacy (01/29/2024)   See flowsheets for full screening details  Depression Screen Depression Screening Exception Documentation Depression Screening Exception:: Patient refusal  PHQ 2 & 9 Depression Scale- Over the past 2 weeks, how often have you been bothered by any of the following problems? Little interest or pleasure in doing things: 0 Feeling down, depressed, or hopeless (PHQ Adolescent also includes...irritable): 0 PHQ-2 Total Score: 0     Goals Addressed               This Visit's Progress     no goal indicated (pt-stated)  Objective:    Today's Vitals   01/29/24 0843  BP: 129/67  Pulse: 69  Temp: 98 F (36.7 C)  TempSrc: Oral  SpO2: 95%  Weight: 197 lb 9.6 oz (89.6 kg)  Height: 5' 10 (1.778 m)  PainSc: 0-No pain   Body mass index is 28.35 kg/m.  Hearing/Vision screen No results found. Immunizations and Health Maintenance Health Maintenance  Topic Date Due   Colonoscopy  03/23/2024   COVID-19 Vaccine (3 - 2025-26 season) 02/14/2024 (Originally 10/29/2023)   Zoster Vaccines- Shingrix (1 of 2) 04/28/2024 (Originally 10/03/2002)   Medicare Annual Wellness (AWV)  01/28/2025    DTaP/Tdap/Td (2 - Td or Tdap) 01/11/2027   Pneumococcal Vaccine: 50+ Years  Completed   Influenza Vaccine  Completed   Hepatitis C Screening  Completed   Meningococcal B Vaccine  Aged Out        Assessment/Plan:  This is a routine wellness examination for Dennis Mclaughlin.  Patient Care Team: Melvin Pao, NP as PCP - General  I have personally reviewed and noted the following in the patient's chart:   Medical and social history Use of alcohol, tobacco or illicit drugs  Current medications and supplements including opioid prescriptions. Functional ability and status Nutritional status Physical activity Advanced directives List of other physicians Hospitalizations, surgeries, and ER visits in previous 12 months Vitals Screenings to include cognitive, depression, and falls Referrals and appointments  No orders of the defined types were placed in this encounter.  In addition, I have reviewed and discussed with patient certain preventive protocols, quality metrics, and best practice recommendations. A written personalized care plan for preventive services as well as general preventive health recommendations were provided to patient.   Joya GORMAN Louder, CMA   01/29/2024   No follow-ups on file.  After Visit Summary: (In Person-Printed) AVS printed and given to the patient

## 2024-01-29 NOTE — Assessment & Plan Note (Signed)
 Chronic.  Controlled.  Has not been taking Atorvastatin  due to concern for dementia with the medication.  Advised of risk of not taking the medication.  He has elected to take red yeast rice.  Labs ordered today.  Return to clinic in 6 months for reevaluation.  Call sooner if concerns arise.

## 2024-01-29 NOTE — Assessment & Plan Note (Signed)
 Last A1c was 6.0%.  Praised patient for efforts to make changes and improve A1c.  Continue with walking daily and avoiding sugary drinks as well as his ice cream.

## 2024-01-29 NOTE — Progress Notes (Signed)
 BP 129/67 (BP Location: Left Arm, Patient Position: Sitting, Cuff Size: Normal)   Pulse 69   Temp 98 F (36.7 C) (Oral)   Ht 5' 10 (1.778 m)   Wt 197 lb 9.6 oz (89.6 kg)   SpO2 95%   BMI 28.35 kg/m    Subjective:    Patient ID: Dennis Mclaughlin, male    DOB: 12/30/1950, 73 y.o.   MRN: [id number]  HPI: Dennis Mclaughlin is a 73 y.o. male  Chief Complaint  Patient presents with   Hypertension   Hyperlipidemia    6 month F/u.   HYPERTENSION / HYPERLIPIDEMIA Patient states he stopped taking the atorvastatin  and went to red meat.  He is concerned about dementia.  Satisfied with current treatment? yes Duration of hypertension: years BP monitoring frequency: daily BP range: 120/80 BP medication side effects: no Past BP meds: HCTZ Duration of hyperlipidemia: years Cholesterol medication side effects: no Cholesterol supplements: none Past cholesterol medications: atorvastain (lipitor) Medication compliance: excellent compliance Aspirin: no Recent stressors: no Recurrent headaches: no Visual changes: yes Palpitations: no Dyspnea: no Chest pain: no Lower extremity edema:no Dizzy/lightheaded: no   MOOD Patient states his mood has been fine.  Denies concerns at visit today.  Denies SI. He does not feel like he needs medication.    Relevant past medical, surgical, family and social history reviewed and updated as indicated. Interim medical history since our last visit reviewed.  Allergies and medications reviewed and updated.  Review of Systems  Eyes:  Negative for visual disturbance.  Respiratory:  Negative for shortness of breath.   Cardiovascular:  Negative for chest pain and leg swelling.  Neurological:  Negative for light-headedness and headaches.  Psychiatric/Behavioral:  Negative for dysphoric mood.     Per HPI unless specifically indicated above     Objective:    BP 129/67 (BP Location: Left Arm, Patient Position: Sitting, Cuff Size: Normal)   Pulse 69    Temp 98 F (36.7 C) (Oral)   Ht 5' 10 (1.778 m)   Wt 197 lb 9.6 oz (89.6 kg)   SpO2 95%   BMI 28.35 kg/m   Wt Readings from Last 3 Encounters:  01/29/24 197 lb 9.6 oz (89.6 kg)  07/30/23 195 lb (88.5 kg)  03/14/23 190 lb (86.2 kg)    Physical Exam Vitals and nursing note reviewed.  Constitutional:      General: He is not in acute distress.    Appearance: Normal appearance. He is not ill-appearing, toxic-appearing or diaphoretic.  HENT:     Head: Normocephalic.     Right Ear: External ear normal.     Left Ear: External ear normal.     Nose: Nose normal. No congestion or rhinorrhea.     Mouth/Throat:     Mouth: Mucous membranes are moist.  Eyes:     General:        Right eye: No discharge.        Left eye: No discharge.     Extraocular Movements: Extraocular movements intact.     Conjunctiva/sclera: Conjunctivae normal.     Pupils: Pupils are equal, round, and reactive to light.  Cardiovascular:     Rate and Rhythm: Normal rate and regular rhythm.     Heart sounds: No murmur heard. Pulmonary:     Effort: Pulmonary effort is normal. No respiratory distress.     Breath sounds: Normal breath sounds. No wheezing, rhonchi or rales.  Abdominal:     General: Abdomen is  flat. Bowel sounds are normal.  Musculoskeletal:     Cervical back: Normal range of motion and neck supple.  Skin:    General: Skin is warm and dry.     Capillary Refill: Capillary refill takes less than 2 seconds.  Neurological:     General: No focal deficit present.     Mental Status: He is alert and oriented to person, place, and time.  Psychiatric:        Mood and Affect: Mood normal.        Behavior: Behavior normal.        Thought Content: Thought content normal.        Judgment: Judgment normal.     Results for orders placed or performed in visit on 08/08/23  Thyroid  antibodies (Thyroperoxidase & Thyroglobulin)   Collection Time: 08/08/23  8:47 AM  Result Value Ref Range   Thyroperoxidase Ab  SerPl-aCnc 15 0 - 34 IU/mL   Thyroglobulin Antibody <1.0 0.0 - 0.9 IU/mL  T4, free   Collection Time: 08/08/23  8:47 AM  Result Value Ref Range   Free T4 1.25 0.82 - 1.77 ng/dL  TSH   Collection Time: 08/08/23  8:47 AM  Result Value Ref Range   TSH 4.410 0.450 - 4.500 uIU/mL      Assessment & Plan:   Problem List Items Addressed This Visit       Cardiovascular and Mediastinum   Essential hypertension   Chronic.  Controlled.  Continue with current medication regimen of HCTZ 12.5mg .  Refills sent today.  Labs ordered today.  Return to clinic in 6 months for reevaluation.  Call sooner if concerns arise.       Relevant Medications   hydrochlorothiazide  (HYDRODIURIL ) 12.5 MG tablet   Other Relevant Orders   Comprehensive metabolic panel with GFR     Other   Hyperlipidemia   Chronic.  Controlled.  Has not been taking Atorvastatin  due to concern for dementia with the medication.  Advised of risk of not taking the medication.  He has elected to take red yeast rice.  Labs ordered today.  Return to clinic in 6 months for reevaluation.  Call sooner if concerns arise.       Relevant Medications   hydrochlorothiazide  (HYDRODIURIL ) 12.5 MG tablet   Other Relevant Orders   Lipid panel   Prediabetes - Primary   Last A1c was 6.0%.  Praised patient for efforts to make changes and improve A1c.  Continue with walking daily and avoiding sugary drinks as well as his ice cream.       Relevant Orders   Hemoglobin A1c   Other Visit Diagnoses       Encounter for Medicare annual wellness exam         Screening for colon cancer       Relevant Orders   Ambulatory referral to Gastroenterology        Follow up plan: Return in about 6 months (around 07/29/2024) for Physical and Fasting labs.

## 2024-01-29 NOTE — Assessment & Plan Note (Signed)
Chronic.  Controlled.  Continue with current medication regimen of HCTZ 12.5mg .  Refills sent today.  Labs ordered today.  Return to clinic in 6 months for reevaluation.  Call sooner if concerns arise.

## 2024-01-30 ENCOUNTER — Ambulatory Visit: Payer: Self-pay | Admitting: Nurse Practitioner

## 2024-01-30 LAB — COMPREHENSIVE METABOLIC PANEL WITH GFR
ALT: 30 IU/L (ref 0–44)
AST: 27 IU/L (ref 0–40)
Albumin: 4.2 g/dL (ref 3.8–4.8)
Alkaline Phosphatase: 90 IU/L (ref 47–123)
BUN/Creatinine Ratio: 14 (ref 10–24)
BUN: 13 mg/dL (ref 8–27)
Bilirubin Total: 0.4 mg/dL (ref 0.0–1.2)
CO2: 23 mmol/L (ref 20–29)
Calcium: 9.6 mg/dL (ref 8.6–10.2)
Chloride: 102 mmol/L (ref 96–106)
Creatinine, Ser: 0.96 mg/dL (ref 0.76–1.27)
Globulin, Total: 2.3 g/dL (ref 1.5–4.5)
Glucose: 91 mg/dL (ref 70–99)
Potassium: 5.3 mmol/L — ABNORMAL HIGH (ref 3.5–5.2)
Sodium: 139 mmol/L (ref 134–144)
Total Protein: 6.5 g/dL (ref 6.0–8.5)
eGFR: 85 mL/min/1.73 (ref >59)

## 2024-01-30 LAB — LIPID PANEL
Chol/HDL Ratio: 7.1 ratio — ABNORMAL HIGH (ref 0.0–5.0)
Cholesterol, Total: 278 mg/dL — ABNORMAL HIGH (ref 100–199)
HDL: 39 mg/dL — ABNORMAL LOW (ref >39)
LDL Chol Calc (NIH): 211 mg/dL — ABNORMAL HIGH (ref 0–99)
Triglycerides: 151 mg/dL — ABNORMAL HIGH (ref 0–149)
VLDL Cholesterol Cal: 28 mg/dL (ref 5–40)

## 2024-01-30 LAB — HEMOGLOBIN A1C
Est. average glucose Bld gHb Est-mCnc: 120 mg/dL
Hgb A1c MFr Bld: 5.8 % — ABNORMAL HIGH (ref 4.8–5.6)

## 2024-03-12 ENCOUNTER — Ambulatory Visit (INDEPENDENT_AMBULATORY_CARE_PROVIDER_SITE_OTHER): Admitting: Nurse Practitioner

## 2024-03-12 ENCOUNTER — Ambulatory Visit: Payer: Self-pay

## 2024-03-12 ENCOUNTER — Encounter: Payer: Self-pay | Admitting: Nurse Practitioner

## 2024-03-12 VITALS — BP 114/73 | HR 86 | Temp 97.9°F | Ht 70.0 in | Wt 196.0 lb

## 2024-03-12 DIAGNOSIS — M7032 Other bursitis of elbow, left elbow: Secondary | ICD-10-CM | POA: Diagnosis not present

## 2024-03-12 MED ORDER — PREDNISONE 10 MG PO TABS: 50.0000 mg | ORAL_TABLET | Freq: Every day | ORAL | 0 refills | Status: DC

## 2024-03-12 NOTE — Telephone Encounter (Signed)
 FYI Only or Action Required?: FYI only for provider: appointment scheduled on 1/14.  Patient was last seen in primary care on 01/29/2024 by Melvin Pao, NP.  Called Nurse Triage reporting Elbow Pain.  Symptoms began several days ago.  Interventions attempted: Rest, hydration, or home remedies and Ice/heat application.  Symptoms are: gradually worsening.  Triage Disposition: See HCP Within 4 Hours (Or PCP Triage)  Patient/caregiver understands and will follow disposition?: Yes       Copied from CRM [id number]. Topic: Clinical - Red Word Triage >> Mar 12, 2024  8:25 AM Amy B wrote: Red Word that prompted transfer to Nurse Triage: Swollen left elbow, pain, redness Reason for Disposition  [1] Looks infected (e.g., spreading redness, pus) AND [2] large red area (> 2 inches or 5 cm)  Answer Assessment - Initial Assessment Questions This RN recommended pt be examined in next 4 hours, scheduled for this morning with PCP.     1. LOCATION: Where is the swelling? (e.g., left, right, both elbows)     Left elbow  2. SIZE and DESCRIPTION: What does the swelling look like? (e.g., entire elbow, localized)     Swollen, redness, Kind of warm/hot, got fluid on it Very sore 7-8/10  4. WORK OR EXERCISE: Has there been any recent work, exercise or other activity that involved that part of the body?      Was painting at house Scrubbing baseboards Was holding self up with this elbow  5. AGGRAVATING FACTORS: What makes the elbow swelling worse? (e.g., work, sports activities)     Hitting it on things  7. OTHER SYMPTOMS: Do you have any other symptoms? (e.g., fever)  Interrupted sleep  Denies: Fever SOB Chest pain Open wound or pus  Protocols used: Elbow Swelling-A-AH

## 2024-03-12 NOTE — Progress Notes (Signed)
 "  BP 114/73 (BP Location: Right Arm, Cuff Size: Large)   Pulse 86   Temp 97.9 F (36.6 C) (Oral)   Ht 5' 10 (1.778 m)   Wt 196 lb (88.9 kg)   SpO2 96%   BMI 28.12 kg/m    Subjective:    Patient ID: Dennis Mclaughlin, male    DOB: 06-05-1950, 74 y.o.   MRN: [id number]  HPI: Dennis Mclaughlin is a 74 y.o. male  Chief Complaint  Patient presents with   Elbow Pain    Swelling & redness. Patient stated it's the left arm and it's been that way since Monday of this week and he hurt it Friday of last week.   ARM PAIN Duration: started on Monday. He was painting and laying on his elbow.  He has redness, warmth and swelling of the left elbow.    Has worsened since Monday.  Barely able to touch the area.   Relevant past medical, surgical, family and social history reviewed and updated as indicated. Interim medical history since our last visit reviewed. Allergies and medications reviewed and updated.  Review of Systems  Musculoskeletal:        Left elbow pain, warmth and swelling    Per HPI unless specifically indicated above     Objective:    BP 114/73 (BP Location: Right Arm, Cuff Size: Large)   Pulse 86   Temp 97.9 F (36.6 C) (Oral)   Ht 5' 10 (1.778 m)   Wt 196 lb (88.9 kg)   SpO2 96%   BMI 28.12 kg/m   Wt Readings from Last 3 Encounters:  03/12/24 196 lb (88.9 kg)  01/29/24 197 lb 9.6 oz (89.6 kg)  07/30/23 195 lb (88.5 kg)    Physical Exam Vitals and nursing note reviewed.  Constitutional:      General: He is not in acute distress.    Appearance: Normal appearance. He is not ill-appearing, toxic-appearing or diaphoretic.  HENT:     Head: Normocephalic.     Right Ear: External ear normal.     Left Ear: External ear normal.     Nose: Nose normal. No congestion or rhinorrhea.     Mouth/Throat:     Mouth: Mucous membranes are moist.  Eyes:     General:        Right eye: No discharge.        Left eye: No discharge.     Extraocular Movements: Extraocular movements  intact.     Conjunctiva/sclera: Conjunctivae normal.     Pupils: Pupils are equal, round, and reactive to light.  Cardiovascular:     Rate and Rhythm: Normal rate and regular rhythm.     Heart sounds: No murmur heard. Pulmonary:     Effort: Pulmonary effort is normal. No respiratory distress.     Breath sounds: Normal breath sounds. No wheezing, rhonchi or rales.  Abdominal:     General: Abdomen is flat. Bowel sounds are normal.  Musculoskeletal:     Left elbow: Swelling and effusion present. Tenderness present in lateral epicondyle.     Cervical back: Normal range of motion and neck supple.     Comments: Swelling, warmth, redness over lateral epicondyle  Skin:    General: Skin is warm and dry.     Capillary Refill: Capillary refill takes less than 2 seconds.  Neurological:     General: No focal deficit present.     Mental Status: He is alert and oriented to person, place,  and time.  Psychiatric:        Mood and Affect: Mood normal.        Behavior: Behavior normal.        Thought Content: Thought content normal.        Judgment: Judgment normal.     Results for orders placed or performed in visit on 01/29/24  Comprehensive metabolic panel with GFR   Collection Time: 01/29/24  9:18 AM  Result Value Ref Range   Glucose 91 70 - 99 mg/dL   BUN 13 8 - 27 mg/dL   Creatinine, Ser 9.03 0.76 - 1.27 mg/dL   eGFR 85 >40 fO/fpw/8.26   BUN/Creatinine Ratio 14 10 - 24   Sodium 139 134 - 144 mmol/L   Potassium 5.3 (H) 3.5 - 5.2 mmol/L   Chloride 102 96 - 106 mmol/L   CO2 23 20 - 29 mmol/L   Calcium  9.6 8.6 - 10.2 mg/dL   Total Protein 6.5 6.0 - 8.5 g/dL   Albumin 4.2 3.8 - 4.8 g/dL   Globulin, Total 2.3 1.5 - 4.5 g/dL   Bilirubin Total 0.4 0.0 - 1.2 mg/dL   Alkaline Phosphatase 90 47 - 123 IU/L   AST 27 0 - 40 IU/L   ALT 30 0 - 44 IU/L  Hemoglobin A1c   Collection Time: 01/29/24  9:18 AM  Result Value Ref Range   Hgb A1c MFr Bld 5.8 (H) 4.8 - 5.6 %   Est. average glucose Bld  gHb Est-mCnc 120 mg/dL  Lipid panel   Collection Time: 01/29/24  9:18 AM  Result Value Ref Range   Cholesterol, Total 278 (H) 100 - 199 mg/dL   Triglycerides 848 (H) 0 - 149 mg/dL   HDL 39 (L) >60 mg/dL   VLDL Cholesterol Cal 28 5 - 40 mg/dL   LDL Chol Calc (NIH) 788 (H) 0 - 99 mg/dL   LDL CALC COMMENT: Comment    Chol/HDL Ratio 7.1 (H) 0.0 - 5.0 ratio      Assessment & Plan:   Problem List Items Addressed This Visit   None Visit Diagnoses       Bursitis of other bursa of left elbow    -  Primary   Will treat with prednisone  50mg  x 5 days. Follow up in 1 week for reevaluation, bursa may need to be drained.        Follow up plan: Return in about 1 week (around 03/19/2024) for Elbow Follow up.      "

## 2024-03-13 ENCOUNTER — Telehealth: Payer: Self-pay

## 2024-03-13 NOTE — Telephone Encounter (Signed)
 Per pt he will call when get his insurance all straighten out. He wants to have procedure but can't until he get the correct insurance.PT changed insurance and it somehow got mix up.

## 2024-03-13 NOTE — Telephone Encounter (Signed)
 Patient has been contacted and informed him that he can call me back to schedule when he has gotten insurance straight.  Thanks,  Wilson, CMA

## 2024-03-14 ENCOUNTER — Ambulatory Visit: Payer: Self-pay | Admitting: Urology

## 2024-03-20 ENCOUNTER — Encounter: Payer: Self-pay | Admitting: Nurse Practitioner

## 2024-03-20 ENCOUNTER — Ambulatory Visit (INDEPENDENT_AMBULATORY_CARE_PROVIDER_SITE_OTHER): Admitting: Nurse Practitioner

## 2024-03-20 VITALS — BP 116/69 | HR 73 | Temp 97.9°F | Ht 70.0 in | Wt 196.6 lb

## 2024-03-20 DIAGNOSIS — M7032 Other bursitis of elbow, left elbow: Secondary | ICD-10-CM

## 2024-03-20 MED ORDER — PREDNISONE 10 MG PO TABS: 50.0000 mg | ORAL_TABLET | Freq: Every day | ORAL | 0 refills | Status: AC

## 2024-03-20 NOTE — Progress Notes (Signed)
 "  BP 116/69 (BP Location: Left Arm, Patient Position: Sitting, Cuff Size: Normal)   Pulse 73   Temp 97.9 F (36.6 C) (Oral)   Ht 5' 10 (1.778 m)   Wt 196 lb 9.6 oz (89.2 kg)   SpO2 96%   BMI 28.21 kg/m    Subjective:    Patient ID: Dennis Mclaughlin, male    DOB: October 25, 1950, 74 y.o.   MRN: [id number]  HPI: Dennis Mclaughlin is a 74 y.o. male  Chief Complaint  Patient presents with   office visit    1 week F/u   ARM PAIN Duration: started about two weeks ago.  Completed course of prednisone  which has helped decrease the swelling, pain and warmth.  However, it is still very sore, slightly swollen and red.  Relevant past medical, surgical, family and social history reviewed and updated as indicated. Interim medical history since our last visit reviewed. Allergies and medications reviewed and updated.  Review of Systems  Musculoskeletal:        Left elbow pain, warmth and swelling    Per HPI unless specifically indicated above     Objective:    BP 116/69 (BP Location: Left Arm, Patient Position: Sitting, Cuff Size: Normal)   Pulse 73   Temp 97.9 F (36.6 C) (Oral)   Ht 5' 10 (1.778 m)   Wt 196 lb 9.6 oz (89.2 kg)   SpO2 96%   BMI 28.21 kg/m   Wt Readings from Last 3 Encounters:  03/20/24 196 lb 9.6 oz (89.2 kg)  03/12/24 196 lb (88.9 kg)  01/29/24 197 lb 9.6 oz (89.6 kg)    Physical Exam Vitals and nursing note reviewed.  Constitutional:      General: He is not in acute distress.    Appearance: Normal appearance. He is not ill-appearing, toxic-appearing or diaphoretic.  HENT:     Head: Normocephalic.     Right Ear: External ear normal.     Left Ear: External ear normal.     Nose: Nose normal. No congestion or rhinorrhea.     Mouth/Throat:     Mouth: Mucous membranes are moist.  Eyes:     General:        Right eye: No discharge.        Left eye: No discharge.     Extraocular Movements: Extraocular movements intact.     Conjunctiva/sclera: Conjunctivae  normal.     Pupils: Pupils are equal, round, and reactive to light.  Cardiovascular:     Rate and Rhythm: Normal rate and regular rhythm.     Heart sounds: No murmur heard. Pulmonary:     Effort: Pulmonary effort is normal. No respiratory distress.     Breath sounds: Normal breath sounds. No wheezing, rhonchi or rales.  Abdominal:     General: Abdomen is flat. Bowel sounds are normal.  Musculoskeletal:     Left elbow: Swelling and effusion present. Tenderness present in lateral epicondyle.     Cervical back: Normal range of motion and neck supple.     Comments: Swelling, warmth, redness over lateral epicondyle  Skin:    General: Skin is warm and dry.     Capillary Refill: Capillary refill takes less than 2 seconds.  Neurological:     General: No focal deficit present.     Mental Status: He is alert and oriented to person, place, and time.  Psychiatric:        Mood and Affect: Mood normal.  Behavior: Behavior normal.        Thought Content: Thought content normal.        Judgment: Judgment normal.     Results for orders placed or performed in visit on 01/29/24  Comprehensive metabolic panel with GFR   Collection Time: 01/29/24  9:18 AM  Result Value Ref Range   Glucose 91 70 - 99 mg/dL   BUN 13 8 - 27 mg/dL   Creatinine, Ser 9.03 0.76 - 1.27 mg/dL   eGFR 85 >40 fO/fpw/8.26   BUN/Creatinine Ratio 14 10 - 24   Sodium 139 134 - 144 mmol/L   Potassium 5.3 (H) 3.5 - 5.2 mmol/L   Chloride 102 96 - 106 mmol/L   CO2 23 20 - 29 mmol/L   Calcium  9.6 8.6 - 10.2 mg/dL   Total Protein 6.5 6.0 - 8.5 g/dL   Albumin 4.2 3.8 - 4.8 g/dL   Globulin, Total 2.3 1.5 - 4.5 g/dL   Bilirubin Total 0.4 0.0 - 1.2 mg/dL   Alkaline Phosphatase 90 47 - 123 IU/L   AST 27 0 - 40 IU/L   ALT 30 0 - 44 IU/L  Hemoglobin A1c   Collection Time: 01/29/24  9:18 AM  Result Value Ref Range   Hgb A1c MFr Bld 5.8 (H) 4.8 - 5.6 %   Est. average glucose Bld gHb Est-mCnc 120 mg/dL  Lipid panel    Collection Time: 01/29/24  9:18 AM  Result Value Ref Range   Cholesterol, Total 278 (H) 100 - 199 mg/dL   Triglycerides 848 (H) 0 - 149 mg/dL   HDL 39 (L) >60 mg/dL   VLDL Cholesterol Cal 28 5 - 40 mg/dL   LDL Chol Calc (NIH) 788 (H) 0 - 99 mg/dL   LDL CALC COMMENT: Comment    Chol/HDL Ratio 7.1 (H) 0.0 - 5.0 ratio      Assessment & Plan:   Problem List Items Addressed This Visit   None Visit Diagnoses       Bursitis of other bursa of left elbow    -  Primary   Symptoms significantly improved with 1 round of prednisone . Not enough swelling to drain. Will repeat course of prednisone . Return if symptoms do not improve.         Follow up plan: No follow-ups on file.      "

## 2024-04-23 ENCOUNTER — Ambulatory Visit: Admitting: Urology

## 2024-07-31 ENCOUNTER — Encounter: Admitting: Nurse Practitioner
